# Patient Record
Sex: Female | Born: 2000 | Race: Black or African American | Hispanic: No | Marital: Single | Smoking: Never smoker
Health system: Southern US, Community
[De-identification: ages and names within clinical notes are randomized; demographics above are authoritative.]

## PROBLEM LIST (undated history)

## (undated) DIAGNOSIS — N83209 Unspecified ovarian cyst, unspecified side: Secondary | ICD-10-CM

## (undated) DIAGNOSIS — G43109 Migraine with aura, not intractable, without status migrainosus: Secondary | ICD-10-CM

## (undated) HISTORY — PX: MOUTH SURGERY: SHX715

## (undated) HISTORY — PX: UMBILICAL HERNIA REPAIR: SHX196

## (undated) HISTORY — DX: Migraine with aura, not intractable, without status migrainosus: G43.109

---
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
## 2019-05-24 ENCOUNTER — Encounter (HOSPITAL_COMMUNITY): Payer: Self-pay | Admitting: Emergency Medicine

## 2019-05-24 ENCOUNTER — Emergency Department (HOSPITAL_COMMUNITY)
Admission: EM | Admit: 2019-05-24 | Discharge: 2019-05-24 | Disposition: A | Discharge status: Home / Self Care | Payer: BC Managed Care – PPO | Attending: Emergency Medicine | Admitting: Emergency Medicine

## 2019-05-24 DIAGNOSIS — R197 Diarrhea, unspecified: Secondary | ICD-10-CM | POA: Diagnosis not present

## 2019-05-24 DIAGNOSIS — R112 Nausea with vomiting, unspecified: Secondary | ICD-10-CM | POA: Diagnosis not present

## 2019-05-24 DIAGNOSIS — R1084 Generalized abdominal pain: Secondary | ICD-10-CM | POA: Diagnosis not present

## 2019-05-24 LAB — COMPREHENSIVE METABOLIC PANEL
ALT: 23 U/L (ref 0–44)
AST: 27 U/L (ref 15–41)
Albumin: 4.4 g/dL (ref 3.5–5.0)
Alkaline Phosphatase: 25 U/L — ABNORMAL LOW (ref 38–126)
Anion gap: 11 (ref 5–15)
BUN: 11 mg/dL (ref 6–20)
CO2: 20 mmol/L — ABNORMAL LOW (ref 22–32)
Calcium: 9.5 mg/dL (ref 8.9–10.3)
Chloride: 108 mmol/L (ref 98–111)
Creatinine, Ser: 0.76 mg/dL (ref 0.44–1.00)
GFR calc Af Amer: >60 mL/min (ref >60)
GFR calc non Af Amer: >60 mL/min (ref >60)
Glucose, Bld: 136 mg/dL — ABNORMAL HIGH (ref 70–99)
Potassium: 3.8 mmol/L (ref 3.5–5.1)
Sodium: 139 mmol/L (ref 135–145)
Total Bilirubin: 0.6 mg/dL (ref 0.3–1.2)
Total Protein: 8.6 g/dL — ABNORMAL HIGH (ref 6.5–8.1)

## 2019-05-24 LAB — I-STAT BETA HCG BLOOD, ED (MC, WL, AP ONLY): I-stat hCG, quantitative: <5 m[IU]/mL (ref <5)

## 2019-05-24 LAB — CBC
HCT: 36.9 % (ref 36.0–46.0)
Hemoglobin: 11.3 g/dL — ABNORMAL LOW (ref 12.0–15.0)
MCH: 23.6 pg — ABNORMAL LOW (ref 26.0–34.0)
MCHC: 30.6 g/dL (ref 30.0–36.0)
MCV: 77 fL — ABNORMAL LOW (ref 80.0–100.0)
Platelets: 374 10*3/uL (ref 150–400)
RBC: 4.79 MIL/uL (ref 3.87–5.11)
RDW: 17 % — ABNORMAL HIGH (ref 11.5–15.5)
WBC: 16.5 10*3/uL — ABNORMAL HIGH (ref 4.0–10.5)
nRBC: 0 % (ref 0.0–0.2)

## 2019-05-24 LAB — LIPASE, BLOOD: Lipase: 26 U/L (ref 11–51)

## 2019-05-24 MED ORDER — ALUM & MAG HYDROXIDE-SIMETH 200-200-20 MG/5ML PO SUSP
30.0000 mL | Freq: Once | ORAL | Status: AC
  Administered 2019-05-24: 30 mL via ORAL
  Filled 2019-05-24: qty 30

## 2019-05-24 MED ORDER — SODIUM CHLORIDE 0.9 % IV BOLUS
1000.0000 mL | Freq: Once | INTRAVENOUS | Status: AC
  Administered 2019-05-24: 1000 mL via INTRAVENOUS

## 2019-05-24 MED ORDER — SODIUM CHLORIDE 0.9% FLUSH
3.0000 mL | Freq: Once | INTRAVENOUS | Status: AC
  Administered 2019-05-24: 3 mL via INTRAVENOUS

## 2019-05-24 MED ORDER — ONDANSETRON HCL 4 MG/2ML IJ SOLN
4.0000 mg | Freq: Once | INTRAMUSCULAR | Status: AC
  Administered 2019-05-24: 4 mg via INTRAVENOUS
  Filled 2019-05-24: qty 2

## 2019-05-24 MED ORDER — ONDANSETRON 4 MG PO TBDP: ORAL_TABLET | ORAL | 0 refills | Status: DC

## 2019-05-24 NOTE — Discharge Instructions (Signed)
Most commonly this is caused by a virus.  Please return for inability to eat or drink worsening abdominal pain or fever.

## 2019-05-24 NOTE — ED Triage Notes (Signed)
Patient here from home with complaints of abd pain, nausea, vomiting that started 3 hours ago.

## 2019-05-24 NOTE — ED Notes (Signed)
Pt knows a urine specimen is needed and knows to call staff when she is able to void.

## 2019-05-24 NOTE — ED Provider Notes (Signed)
[facility]-EMERGENCY DEPT Provider Note   CSN: [id number] Arrival date & time: 05/24/19  0309    History   Chief Complaint Chief Complaint  Patient presents with  . Abdominal Pain  . Nausea  . Emesis    HPI Christina Mays is a 18 y.o. female.     18 yo F with a chief complaint of nausea and vomiting.  Going on for about 4 hours.  Denies any suspicious food intake prior to the event.  Planing of diffuse abdominal pain.  No fevers or chills.  A very small amount of diarrhea.  No individuals near her with similar illness.  The history is provided by the patient.  Abdominal Pain Pain location:  Generalized Pain quality: aching and cramping   Pain radiates to:  Does not radiate Pain severity:  Moderate Onset quality:  Gradual Duration:  4 hours Timing:  Constant Progression:  Worsening Chronicity:  New Relieved by:  Nothing Worsened by:  Nothing Ineffective treatments:  None tried Associated symptoms: diarrhea (a little), nausea and vomiting   Associated symptoms: no chest pain, no chills, no dysuria, no fever and no shortness of breath   Emesis Associated symptoms: abdominal pain and diarrhea (a little)   Associated symptoms: no arthralgias, no chills, no fever, no headaches and no myalgias     History reviewed. No pertinent past medical history.  There are no active problems to display for this patient.   Past Surgical History:  Procedure Laterality Date  . MOUTH SURGERY    . UMBILICAL HERNIA REPAIR       OB History   No obstetric history on file.      Home Medications    Prior to Admission medications   Medication Sig Start Date End Date Taking? Authorizing Provider  JUNEL FE 1/20 1-20 MG-MCG tablet Take 1 tablet by mouth daily. 05/18/19  Yes [provider]  ondansetron (ZOFRAN ODT) 4 MG disintegrating tablet 4mg  ODT q4 hours prn nausea/vomit 05/24/19   Melene PlanFloyd, Tameya Kuznia, DO    Family History No family history on file.  Social  History Social History   Tobacco Use  . Smoking status: Never Smoker  . Smokeless tobacco: Never Used  Substance Use Topics  . Alcohol use: No  . Drug use: Not on file     Allergies   Patient has no known allergies.   Review of Systems Review of Systems  Constitutional: Negative for chills and fever.  HENT: Negative for congestion and rhinorrhea.   Eyes: Negative for redness and visual disturbance.  Respiratory: Negative for shortness of breath and wheezing.   Cardiovascular: Negative for chest pain and palpitations.  Gastrointestinal: Positive for abdominal pain, diarrhea (a little), nausea and vomiting.  Genitourinary: Negative for dysuria and urgency.  Musculoskeletal: Negative for arthralgias and myalgias.  Skin: Negative for pallor and wound.  Neurological: Negative for dizziness and headaches.     Physical Exam Updated Vital Signs BP 117/71 (BP Location: Right Arm)   Pulse 80   Temp 98.3 F (36.8 C) (Oral)   Resp 14   Ht 5' (1.524 m)   Wt 52.2 kg   SpO2 100%   BMI 22.46 kg/m   Physical Exam Vitals signs and nursing note reviewed.  Constitutional:      General: She is not in acute distress.    Appearance: She is well-developed. She is not diaphoretic.  HENT:     Head: Normocephalic and atraumatic.  Eyes:     Pupils: Pupils  are equal, round, and reactive to light.  Neck:     Musculoskeletal: Normal range of motion and neck supple.  Cardiovascular:     Rate and Rhythm: Normal rate and regular rhythm.     Heart sounds: No murmur. No friction rub. No gallop.   Pulmonary:     Effort: Pulmonary effort is normal.     Breath sounds: No wheezing or rales.  Abdominal:     General: There is no distension.     Palpations: Abdomen is soft.     Tenderness: There is generalized abdominal tenderness.  Musculoskeletal:        General: No tenderness.  Skin:    General: Skin is warm and dry.  Neurological:     Mental Status: She is alert and oriented to person,  place, and time.  Psychiatric:        Behavior: Behavior normal.      ED Treatments / Results  Labs (all labs ordered are listed, but only abnormal results are displayed) Labs Reviewed  COMPREHENSIVE METABOLIC PANEL - Abnormal; Notable for the following components:      Result Value   CO2 20 (*)    Glucose, Bld 136 (*)    Total Protein 8.6 (*)    Alkaline Phosphatase 25 (*)    All other components within normal limits  CBC - Abnormal; Notable for the following components:   WBC 16.5 (*)    Hemoglobin 11.3 (*)    MCV 77.0 (*)    MCH 23.6 (*)    RDW 17.0 (*)    All other components within normal limits  LIPASE, BLOOD  URINALYSIS, ROUTINE W REFLEX MICROSCOPIC  I-STAT BETA HCG BLOOD, ED ([facility], [facility], [facility] ONLY)    EKG None  Radiology No results found.  Procedures Procedures (including critical care time)  Medications Ordered in ED Medications  sodium chloride flush (NS) 0.9 % injection 3 mL (3 mLs Intravenous Given 05/24/19 0400)  sodium chloride 0.9 % bolus 1,000 mL (1,000 mLs Intravenous New Bag/Given 05/24/19 0359)  ondansetron (ZOFRAN) injection 4 mg (4 mg Intravenous Given 05/24/19 0400)  alum & mag hydroxide-simeth (MAALOX/MYLANTA) 200-200-20 MG/5ML suspension 30 mL (30 mLs Oral Given 05/24/19 0350)     Initial Impression / Assessment and Plan / ED Course  I have reviewed the triage vital signs and the nursing notes.  Pertinent labs & imaging results that were available during my care of the patient were reviewed by me and considered in my medical decision making (see chart for details).        18 yo F with a chief complaint of nausea vomiting and diffuse abdominal tenderness.  This started about 4 hours ago.  We will give nausea medicine fluids and reassess.  Patient feeling much better on reassessment.  Lab work without significant LFT elevation lipase is normal patient is not pregnant.  We will discharge the patient home.  PCP follow-up.  4:47 AM:  I have  discussed the diagnosis/risks/treatment options with the patient and believe the pt to be eligible for discharge home to follow-up with PCP. We also discussed returning to the ED immediately if new or worsening sx occur. We discussed the sx which are most concerning (e.g., sudden worsening pain, fever, inability to tolerate by mouth) that necessitate immediate return. Medications administered to the patient during their visit and any new prescriptions provided to the patient are listed below.  Medications given during this visit Medications  sodium chloride flush (NS) 0.9 % injection  3 mL (3 mLs Intravenous Given 05/24/19 0400)  sodium chloride 0.9 % bolus 1,000 mL (1,000 mLs Intravenous New Bag/Given 05/24/19 0359)  ondansetron (ZOFRAN) injection 4 mg (4 mg Intravenous Given 05/24/19 0400)  alum & mag hydroxide-simeth (MAALOX/MYLANTA) 200-200-20 MG/5ML suspension 30 mL (30 mLs Oral Given 05/24/19 0350)     The patient appears reasonably screen and/or stabilized for discharge and I doubt any other medical condition or other [facility] requiring further screening, evaluation, or treatment in the ED at this time prior to discharge.    Final Clinical Impressions(s) / ED Diagnoses   Final diagnoses:  Nausea vomiting and diarrhea    ED Discharge Orders         Ordered    ondansetron (ZOFRAN ODT) 4 MG disintegrating tablet     05/24/19 0445           Melene PlanFloyd, Cynthie Garmon, DO 05/24/19 [phone]

## 2019-09-05 ENCOUNTER — Encounter (HOSPITAL_COMMUNITY): Payer: Self-pay | Admitting: Emergency Medicine

## 2019-09-05 ENCOUNTER — Emergency Department (HOSPITAL_COMMUNITY)
Admission: EM | Admit: 2019-09-05 | Discharge: 2019-09-05 | Disposition: A | Discharge status: Home / Self Care | Payer: BC Managed Care – PPO | Attending: Emergency Medicine | Admitting: Emergency Medicine

## 2019-09-05 ENCOUNTER — Other Ambulatory Visit: Payer: Self-pay

## 2019-09-05 ENCOUNTER — Emergency Department (HOSPITAL_COMMUNITY): Payer: BC Managed Care – PPO

## 2019-09-05 DIAGNOSIS — N83202 Unspecified ovarian cyst, left side: Secondary | ICD-10-CM

## 2019-09-05 DIAGNOSIS — R1031 Right lower quadrant pain: Secondary | ICD-10-CM | POA: Insufficient documentation

## 2019-09-05 DIAGNOSIS — R112 Nausea with vomiting, unspecified: Secondary | ICD-10-CM | POA: Diagnosis present

## 2019-09-05 DIAGNOSIS — R1084 Generalized abdominal pain: Secondary | ICD-10-CM

## 2019-09-05 LAB — CBC
HCT: 37.8 % (ref 36.0–46.0)
Hemoglobin: 12.4 g/dL (ref 12.0–15.0)
MCH: 26.9 pg (ref 26.0–34.0)
MCHC: 32.8 g/dL (ref 30.0–36.0)
MCV: 82 fL (ref 80.0–100.0)
Platelets: 281 10*3/uL (ref 150–400)
RBC: 4.61 MIL/uL (ref 3.87–5.11)
RDW: 15.9 % — ABNORMAL HIGH (ref 11.5–15.5)
WBC: 7.2 10*3/uL (ref 4.0–10.5)
nRBC: 0 % (ref 0.0–0.2)

## 2019-09-05 LAB — URINALYSIS, ROUTINE W REFLEX MICROSCOPIC
Bacteria, UA: NONE SEEN
Bilirubin Urine: NEGATIVE
Glucose, UA: NEGATIVE mg/dL
Hgb urine dipstick: NEGATIVE
Ketones, ur: 80 mg/dL — AB
Leukocytes,Ua: NEGATIVE
Nitrite: NEGATIVE
Protein, ur: 30 mg/dL — AB
Specific Gravity, Urine: >1.046 — ABNORMAL HIGH (ref 1.005–1.030)
pH: 6 (ref 5.0–8.0)

## 2019-09-05 LAB — COMPREHENSIVE METABOLIC PANEL
ALT: 27 U/L (ref 0–44)
AST: 35 U/L (ref 15–41)
Albumin: 4.9 g/dL (ref 3.5–5.0)
Alkaline Phosphatase: 34 U/L — ABNORMAL LOW (ref 38–126)
Anion gap: 18 — ABNORMAL HIGH (ref 5–15)
BUN: 13 mg/dL (ref 6–20)
CO2: 18 mmol/L — ABNORMAL LOW (ref 22–32)
Calcium: 9.9 mg/dL (ref 8.9–10.3)
Chloride: 102 mmol/L (ref 98–111)
Creatinine, Ser: 0.76 mg/dL (ref 0.44–1.00)
GFR calc Af Amer: >60 mL/min (ref >60)
GFR calc non Af Amer: >60 mL/min (ref >60)
Glucose, Bld: 115 mg/dL — ABNORMAL HIGH (ref 70–99)
Potassium: 3.3 mmol/L — ABNORMAL LOW (ref 3.5–5.1)
Sodium: 138 mmol/L (ref 135–145)
Total Bilirubin: 1.1 mg/dL (ref 0.3–1.2)
Total Protein: 8.2 g/dL — ABNORMAL HIGH (ref 6.5–8.1)

## 2019-09-05 LAB — RAPID URINE DRUG SCREEN, HOSP PERFORMED
Amphetamines: NOT DETECTED
Barbiturates: NOT DETECTED
Benzodiazepines: NOT DETECTED
Cocaine: NOT DETECTED
Opiates: POSITIVE — AB
Tetrahydrocannabinol: POSITIVE — AB

## 2019-09-05 LAB — LIPASE, BLOOD: Lipase: 22 U/L (ref 11–51)

## 2019-09-05 LAB — I-STAT BETA HCG BLOOD, ED (MC, WL, AP ONLY): I-stat hCG, quantitative: <5 m[IU]/mL (ref <5)

## 2019-09-05 MED ORDER — ONDANSETRON 4 MG PO TBDP: ORAL_TABLET | ORAL | 0 refills | Status: DC

## 2019-09-05 MED ORDER — POTASSIUM CHLORIDE 10 MEQ/100ML IV SOLN
10.0000 meq | Freq: Once | INTRAVENOUS | Status: DC
  Filled 2019-09-05: qty 100

## 2019-09-05 MED ORDER — POTASSIUM CHLORIDE CRYS ER 20 MEQ PO TBCR
40.0000 meq | EXTENDED_RELEASE_TABLET | Freq: Once | ORAL | Status: AC
  Administered 2019-09-05: 40 meq via ORAL
  Filled 2019-09-05: qty 2

## 2019-09-05 MED ORDER — SODIUM CHLORIDE 0.9 % IV BOLUS
1000.0000 mL | Freq: Once | INTRAVENOUS | Status: AC
  Administered 2019-09-05: 1000 mL via INTRAVENOUS

## 2019-09-05 MED ORDER — SODIUM CHLORIDE 0.9% FLUSH: 3.0000 mL | Freq: Once | INTRAVENOUS | Status: DC

## 2019-09-05 MED ORDER — ONDANSETRON HCL 4 MG/2ML IJ SOLN
4.0000 mg | Freq: Once | INTRAMUSCULAR | Status: AC
  Administered 2019-09-05: 4 mg via INTRAVENOUS
  Filled 2019-09-05: qty 2

## 2019-09-05 MED ORDER — DROPERIDOL 2.5 MG/ML IJ SOLN
2.5000 mg | Freq: Once | INTRAMUSCULAR | Status: AC
  Administered 2019-09-05: 2.5 mg via INTRAVENOUS
  Filled 2019-09-05: qty 2

## 2019-09-05 MED ORDER — MORPHINE SULFATE (PF) 4 MG/ML IV SOLN
4.0000 mg | Freq: Once | INTRAVENOUS | Status: AC
  Administered 2019-09-05: 4 mg via INTRAVENOUS
  Filled 2019-09-05: qty 1

## 2019-09-05 MED ORDER — FAMOTIDINE IN NACL 20-0.9 MG/50ML-% IV SOLN
20.0000 mg | Freq: Once | INTRAVENOUS | Status: AC
  Administered 2019-09-05: 10:00:00 20 mg via INTRAVENOUS
  Filled 2019-09-05: qty 50

## 2019-09-05 MED ORDER — FAMOTIDINE 20 MG PO TABS: 20.0000 mg | ORAL_TABLET | Freq: Two times a day (BID) | ORAL | 0 refills | Status: DC

## 2019-09-05 MED ORDER — IOHEXOL 300 MG/ML  SOLN
100.0000 mL | Freq: Once | INTRAMUSCULAR | Status: AC | PRN
  Administered 2019-09-05: 100 mL via INTRAVENOUS

## 2019-09-05 MED ORDER — SODIUM CHLORIDE 0.9 % IV BOLUS
1000.0000 mL | Freq: Once | INTRAVENOUS | Status: AC
  Administered 2019-09-05: 10:00:00 1000 mL via INTRAVENOUS

## 2019-09-05 MED ORDER — POTASSIUM CHLORIDE 10 MEQ/100ML IV SOLN
10.0000 meq | Freq: Once | INTRAVENOUS | Status: AC
  Administered 2019-09-05: 16:00:00 10 meq via INTRAVENOUS

## 2019-09-05 NOTE — ED Provider Notes (Signed)
[facility] EMERGENCY DEPARTMENT Provider Note   CSN: [id number] Arrival date & time: 09/05/19  0911     History   Chief Complaint Chief Complaint  Patient presents with  . Abdominal Pain  . Emesis    HPI Christina Mays is a 18 y.o. female.     Christina Mays is a 18 y.o. female who is otherwise healthy, presents to the emergency department for evaluation of nausea, vomiting and generalized abdominal pain.  Patient reports symptoms began 2 days ago.  Patient reports she has had persistent nausea and vomiting, denies hematemesis.  Patient reports she has not been able to keep down any food or drink.  She reports abdominal pain is generalized and severe.  She denies diarrhea, constipation or blood in the stools.  Denies any dysuria or urinary frequency.  She denies any fevers or chills.  No vaginal bleeding or discharge, no pelvic pain.  Patient has not taken any medications to treat the symptoms prior to arrival.  She does report that she recently restarted birth control, had a severe reaction to birth control previously and wonders if this could be contributing.  No other new medications.  Patient does report intermittent marijuana use, denies any other drug use.  Reports she has had some intermittent issues with nausea and vomiting but never this severe.       History reviewed. No pertinent past medical history.  There are no active problems to display for this patient.   Past Surgical History:  Procedure Laterality Date  . MOUTH SURGERY    . UMBILICAL HERNIA REPAIR       OB History   No obstetric history on file.      Home Medications    Prior to Admission medications   Medication Sig Start Date End Date Taking? Authorizing Provider  famotidine (PEPCID) 20 MG tablet Take 1 tablet (20 mg total) by mouth 2 (two) times daily. 09/05/19   Dartha Lodge, PA-C  JUNEL FE 1/20 1-20 MG-MCG tablet Take 1 tablet by mouth daily. 05/18/19   [provider]   ondansetron (ZOFRAN ODT) 4 MG disintegrating tablet 4mg  ODT q4 hours prn nausea/vomit 09/05/19   11/05/19, PA-C    Family History History reviewed. No pertinent family history.  Social History Social History   Tobacco Use  . Smoking status: Never Smoker  . Smokeless tobacco: Never Used  Substance Use Topics  . Alcohol use: No  . Drug use: Yes    Types: Marijuana    Comment: everyday     Allergies   Patient has no known allergies.   Review of Systems Review of Systems  Constitutional: Negative for chills and fever.  Respiratory: Negative for cough and shortness of breath.   Cardiovascular: Negative for chest pain.  Gastrointestinal: Positive for abdominal pain, nausea and vomiting. Negative for blood in stool, constipation and diarrhea.  Genitourinary: Negative for dysuria, flank pain, frequency, vaginal bleeding and vaginal discharge.  Musculoskeletal: Negative for arthralgias and myalgias.  Skin: Negative for color change and rash.  All other systems reviewed and are negative.    Physical Exam Updated Vital Signs Temp 98.4 F (36.9 C) (Oral)   Resp 18   Ht 5' (1.524 m)   Wt 45.4 kg   LMP 08/06/2019   BMI 19.53 kg/m   Physical Exam Vitals signs and nursing note reviewed.  Constitutional:      General: She is in acute distress.     Appearance: She is  well-developed and normal weight. She is not diaphoretic.     Comments: Appears to be in some distress, actively vomiting, sitting on the floor in room upon my evaluation.  HENT:     Head: Normocephalic and atraumatic.     Mouth/Throat:     Comments: Mucous membranes slightly dry Eyes:     General:        Right eye: No discharge.        Left eye: No discharge.     Pupils: Pupils are equal, round, and reactive to light.  Neck:     Musculoskeletal: Neck supple.  Cardiovascular:     Rate and Rhythm: Normal rate and regular rhythm.     Heart sounds: Normal heart sounds. No murmur. No friction rub.   Pulmonary:     Effort: Pulmonary effort is normal. No respiratory distress.     Breath sounds: Normal breath sounds. No wheezing or rales.     Comments: Respirations equal and unlabored, patient able to speak in full sentences, lungs clear to auscultation bilaterally Abdominal:     General: Bowel sounds are normal. There is no distension.     Palpations: Abdomen is soft. There is no mass.     Tenderness: There is generalized abdominal tenderness. There is guarding.     Comments: Abdomen is soft and nondistended, bowel sounds present throughout, abdomen with generalized tenderness with mild guarding, pain does not localize to any 1 region.  Musculoskeletal:        General: No deformity.  Skin:    General: Skin is warm and dry.     Capillary Refill: Capillary refill takes less than 2 seconds.  Neurological:     Mental Status: She is alert.     Coordination: Coordination normal.     Comments: Speech is clear, able to follow commands Moves extremities without ataxia, coordination intact  Psychiatric:        Mood and Affect: Mood normal.        Behavior: Behavior normal.      ED Treatments / Results  Labs (all labs ordered are listed, but only abnormal results are displayed) Labs Reviewed  COMPREHENSIVE METABOLIC PANEL - Abnormal; Notable for the following components:      Result Value   Potassium 3.3 (*)    CO2 18 (*)    Glucose, Bld 115 (*)    Total Protein 8.2 (*)    Alkaline Phosphatase 34 (*)    Anion gap 18 (*)    All other components within normal limits  CBC - Abnormal; Notable for the following components:   RDW 15.9 (*)    All other components within normal limits  URINALYSIS, ROUTINE W REFLEX MICROSCOPIC - Abnormal; Notable for the following components:   Specific Gravity, Urine >1.046 (*)    Ketones, ur 80 (*)    Protein, ur 30 (*)    All other components within normal limits  RAPID URINE DRUG SCREEN, HOSP PERFORMED - Abnormal; Notable for the following  components:   Opiates POSITIVE (*)    Tetrahydrocannabinol POSITIVE (*)    All other components within normal limits  LIPASE, BLOOD  I-STAT BETA HCG BLOOD, ED ([facility], [facility], [facility] ONLY)    EKG None  Radiology Ct Abdomen Pelvis W Contrast  Result Date: 09/05/2019 CLINICAL DATA:  Postprandial pain. EXAM: CT ABDOMEN AND PELVIS WITH CONTRAST TECHNIQUE: Multidetector CT imaging of the abdomen and pelvis was performed using the standard protocol following bolus administration of intravenous contrast. CONTRAST:  100mL OMNIPAQUE IOHEXOL 300 MG/ML  SOLN COMPARISON:  None. FINDINGS: Lower chest: Lung bases are clear. No effusions. Heart is normal size. Hepatobiliary: No suspicious focal hepatic abnormality. Gallbladder unremarkable. Low-density adjacent to the falciform ligament likely reflects focal fatty infiltration. Pancreas: No focal abnormality or ductal dilatation. Spleen: No focal abnormality.  Normal size. Adrenals/Urinary Tract: No adrenal abnormality. No focal renal abnormality. No stones or hydronephrosis. Urinary bladder is unremarkable. Stomach/Bowel: Stomach, large and small bowel grossly unremarkable. Normal appendix. Vascular/Lymphatic: No evidence of aneurysm or adenopathy. Reproductive: Ovary and right adnexa unremarkable. 4.2 cm cyst in the left ovary. Other: Trace free fluid in the pelvis, likely physiologic. No free air. Musculoskeletal: No acute bony abnormality. IMPRESSION: 4.2 cm left ovarian cyst. Areas of focal fatty infiltration within the liver adjacent to the falciform ligament. Otherwise unremarkable study. Electronically Signed   By: Charlett NoseKevin  Dover M.D.   On: 09/05/2019 12:18    Procedures Procedures (including critical care time)  Medications Ordered in ED Medications  sodium chloride 0.9 % bolus 1,000 mL (0 mLs Intravenous Stopped 09/05/19 1226)  ondansetron (ZOFRAN) injection 4 mg (4 mg Intravenous Given 09/05/19 0946)  morphine 4 MG/ML injection 4 mg (4 mg Intravenous Given  09/05/19 0946)  famotidine (PEPCID) IVPB 20 mg premix (0 mg Intravenous Stopped 09/05/19 1109)  iohexol (OMNIPAQUE) 300 MG/ML solution 100 mL (100 mLs Intravenous Contrast Given 09/05/19 1201)  droperidol (INAPSINE) 2.5 MG/ML injection 2.5 mg (2.5 mg Intravenous Given 09/05/19 1532)  potassium chloride SA (KLOR-CON) CR tablet 40 mEq (40 mEq Oral Given 09/05/19 1454)  sodium chloride 0.9 % bolus 1,000 mL (0 mLs Intravenous Stopped 09/05/19 1650)  potassium chloride 10 mEq in 100 mL IVPB (0 mEq Intravenous Stopped 09/05/19 1650)     Initial Impression / Assessment and Plan / ED Course  I have reviewed the triage vital signs and the nursing notes.  Pertinent labs & imaging results that were available during my care of the patient were reviewed by me and considered in my medical decision making (see chart for details).  18 year old female presents with 2 days of persistent nausea, vomiting and generalized abdominal pain.  On arrival she is afebrile with normal vitals.  Actively vomiting during my evaluation, no hematemesis.  Abdomen with generalized tenderness with slight guarding.  Lungs clear.  She is not having any urinary symptoms, no vaginal discharge or bleeding.  Patient does report previous hernia surgery.  She denies diarrhea or constipation, does think she is passing gas.  Does admit to marijuana use.  Patient has been seen for nausea and vomiting a few times in the past.  Differential includes gastritis, bowel obstruction, cyclic vomiting or hyperemesis syndrome.  Without focal abdominal pain doubt appendicitis, patient is not having any symptoms to suggest pelvic etiology.  Will get abdominal labs, CT abdomen pelvis given history of previous hernia surgery to rule out obstruction.  Will give IV fluids, Zofran, Pepcid and morphine for symptom management.  Will check EKG for QTC in case patient requires repeat antiemetic dosing.  Lab work is overall reassuring, no leukocytosis and normal hemoglobin,  mild hypokalemia of 3.3, CO2 of 18 and anion gap of 18, likely in the setting of dehydration, normal glucose, normal renal and liver function, normal lipase.  Negative pregnancy.  Urinalysis with 80 of ketones again suggestive of dehydration, no signs of infection.  UDS positive for THC and opiates.  CT abdomen pelvis shows a 4.2 cm left ovarian cyst, otherwise CT is unremarkable.  I have  low suspicion that patient's ovarian cyst is related to pain today given that pain is generalized and she does not have focal severe tenderness in the left lower quadrant, I have low suspicion for torsion, there is no edema or stranding surrounding the left ovary to suggest torsion in this area on CT.  Discussed this with patient, do not feel that pelvic ultrasound is indicated at this time.  Initially after medications patient had significant improvement in her symptoms, but after drinking water she had episode of emesis.  Given reassuring CT and lab work this is increasingly concerning for cyclic vomiting syndrome.  Droperidol ordered as well as IV potassium replacement given persistent vomiting.  Went in to reevaluate patient, she has not yet received droperidol or potassium, but is feeling much better again, will trial p.o. challenge and attempt giving p.o. potassium as patient would like to go home now that she is feeling better.  After potassium patient had a second episode of emesis.  Will give droperidol and IV potassium and then reevaluate.  After droperidol patient has had no additional vomiting.  She has been able to keep down multiple cups of ginger ale.  At this time feel patient is stable for discharge.  Will send home with Zofran, Pepcid.  I have asked her to start with a clear liquid diet and advance her diet slowly.  I have recommended GI follow-up regarding possible cyclic vomiting syndrome.  She will follow-up with her OB/GYN regarding ovarian cyst.  Strict return precautions discussed.  Patient  expresses understanding and agreement with plan.  Discharged home in good condition.  Final Clinical Impressions(s) / ED Diagnoses   Final diagnoses:  Generalized abdominal pain  Non-intractable vomiting with nausea, unspecified vomiting type  Left ovarian cyst    ED Discharge Orders         Ordered    ondansetron (ZOFRAN ODT) 4 MG disintegrating tablet     09/05/19 1642    famotidine (PEPCID) 20 MG tablet  2 times daily     09/05/19 1642           Janet Berlin 09/06/19 2348    Davonna Belling, MD 09/13/19 [phone]

## 2019-09-05 NOTE — Discharge Instructions (Addendum)
Your work-up today has overall been reassuring.  Please use Zofran as needed for nausea and vomiting.  I would like for you to stick to a clear liquid diet for the next 24 hours and then slowly advance diet to bland foods, and then return to your regular diet.  Please eat small frequent meals, when your stomach sits empty that can trigger nausea and vomiting.  Take Pepcid twice daily before breakfast and dinner to help with stomach irritation. Avoid Marijuana, as it can worsen persistent vomiting.  Your CT scan today did show that you have a 4.2 cm left ovarian cyst, please follow-up with your OB/GYN regarding this, I would also recommend that you discontinue birth control be recently started as it could have contributed to your nausea and vomiting.  Make sure you are using a alternate form of contraception until you follow-up with your OB/GYN to discuss other options.  If you develop severe left lower abdominal pain this can be a sign of ovarian torsion, and you should return to the ED for reevaluation.  Follow-up with GI doctor regarding your intermittent issues with vomiting and decreased appetite. Call to schedule appointment with [facility]  Return to the emergency department for persistent vomiting despite medications, new or worsening abdominal pain or any other new or concerning symptoms.

## 2019-09-05 NOTE — ED Triage Notes (Signed)
Pt reports persistent vomiting for the last 2 days. Denies recent fever. Reports generalized abdominal pain. No diarrhea. No cough, cold symptoms.

## 2019-09-05 NOTE — ED Notes (Signed)
Pt dc'd home w/all belongings, a/o x4, ambulatory on dc, driven home by friend  

## 2019-10-01 ENCOUNTER — Other Ambulatory Visit: Payer: Self-pay

## 2019-10-02 ENCOUNTER — Ambulatory Visit: Payer: BC Managed Care – PPO | Admitting: Obstetrics and Gynecology

## 2019-10-02 ENCOUNTER — Encounter: Payer: Self-pay | Admitting: Obstetrics and Gynecology

## 2019-10-02 ENCOUNTER — Telehealth: Payer: Self-pay | Admitting: Obstetrics and Gynecology

## 2019-10-02 VITALS — BP 100/58 | HR 80 | Temp 97.2°F | Ht 60.0 in | Wt 96.8 lb

## 2019-10-02 DIAGNOSIS — N760 Acute vaginitis: Secondary | ICD-10-CM

## 2019-10-02 DIAGNOSIS — B9689 Other specified bacterial agents as the cause of diseases classified elsewhere: Secondary | ICD-10-CM | POA: Diagnosis not present

## 2019-10-02 DIAGNOSIS — R1032 Left lower quadrant pain: Secondary | ICD-10-CM

## 2019-10-02 DIAGNOSIS — Z7189 Other specified counseling: Secondary | ICD-10-CM

## 2019-10-02 DIAGNOSIS — Z23 Encounter for immunization: Secondary | ICD-10-CM

## 2019-10-02 DIAGNOSIS — R110 Nausea: Secondary | ICD-10-CM

## 2019-10-02 DIAGNOSIS — Z113 Encounter for screening for infections with a predominantly sexual mode of transmission: Secondary | ICD-10-CM

## 2019-10-02 DIAGNOSIS — G43109 Migraine with aura, not intractable, without status migrainosus: Secondary | ICD-10-CM | POA: Insufficient documentation

## 2019-10-02 DIAGNOSIS — Z01419 Encounter for gynecological examination (general) (routine) without abnormal findings: Secondary | ICD-10-CM | POA: Diagnosis not present

## 2019-10-02 DIAGNOSIS — Z3009 Encounter for other general counseling and advice on contraception: Secondary | ICD-10-CM

## 2019-10-02 DIAGNOSIS — Z0000 Encounter for general adult medical examination without abnormal findings: Secondary | ICD-10-CM | POA: Diagnosis not present

## 2019-10-02 DIAGNOSIS — R634 Abnormal weight loss: Secondary | ICD-10-CM

## 2019-10-02 DIAGNOSIS — Z7185 Encounter for immunization safety counseling: Secondary | ICD-10-CM

## 2019-10-02 DIAGNOSIS — N898 Other specified noninflammatory disorders of vagina: Secondary | ICD-10-CM

## 2019-10-02 DIAGNOSIS — N83202 Unspecified ovarian cyst, left side: Secondary | ICD-10-CM

## 2019-10-02 MED ORDER — METRONIDAZOLE 0.75 % VA GEL: 1.0000 | Freq: Every day | VAGINAL | 0 refills | Status: DC

## 2019-10-02 NOTE — Telephone Encounter (Signed)
Call placed to convey benefits for Mirena insertion.Spoke with the patient conveyed benefits. She understands/agreeable with the benefits. Patient to call at onset of cycle for scheduling.

## 2019-10-02 NOTE — Patient Instructions (Signed)
EXERCISE AND DIET:  We recommended that you start or continue a regular exercise program for good health. Regular exercise means any activity that makes your heart beat faster and makes you sweat.  We recommend exercising at least 30 minutes per day at least 3 days a week, preferably 4 or 5.  We also recommend a diet low in fat and sugar.  Inactivity, poor dietary choices and obesity can cause diabetes, heart attack, stroke, and kidney damage, among others.    ALCOHOL AND SMOKING:  Women should limit their alcohol intake to no more than 7 drinks/beers/glasses of wine (combined, not each!) per week. Moderation of alcohol intake to this level decreases your risk of breast cancer and liver damage. And of course, no recreational drugs are part of a healthy lifestyle.  And absolutely no smoking or even second hand smoke. Most people know smoking can cause heart and lung diseases, but did you know it also contributes to weakening of your bones? Aging of your skin?  Yellowing of your teeth and nails?  CALCIUM AND VITAMIN D:  Adequate intake of calcium and Vitamin D are recommended.  The recommendations for exact amounts of these supplements seem to change often, but generally speaking 1,000 mg of calcium (between diet and supplement) and 800 units of Vitamin D per day seems prudent. Certain women may benefit from higher intake of Vitamin D.  If you are among these women, your doctor will have told you during your visit.    PAP SMEARS:  Pap smears, to check for cervical cancer or precancers,  have traditionally been done yearly, although recent scientific advances have shown that most women can have pap smears less often.  However, every woman still should have a physical exam from her gynecologist every year. It will include a breast check, inspection of the vulva and vagina to check for abnormal growths or skin changes, a visual exam of the cervix, and then an exam to evaluate the size and shape of the uterus and  ovaries.  And after 19 years of age, a rectal exam is indicated to check for rectal cancers. We will also provide age appropriate advice regarding health maintenance, like when you should have certain vaccines, screening for sexually transmitted diseases, bone density testing, colonoscopy, mammograms, etc.   MAMMOGRAMS:  All women over 40 years old should have a yearly mammogram. Many facilities now offer a "3D" mammogram, which may cost around $50 extra out of pocket. If possible,  we recommend you accept the option to have the 3D mammogram performed.  It both reduces the number of women who will be called back for extra views which then turn out to be normal, and it is better than the routine mammogram at detecting truly abnormal areas.    COLON CANCER SCREENING: Now recommend starting at age 45. At this time colonoscopy is not covered for routine screening until 50. There are take home tests that can be done between 45-49.   COLONOSCOPY:  Colonoscopy to screen for colon cancer is recommended for all women at age 50.  We know, you hate the idea of the prep.  We agree, BUT, having colon cancer and not knowing it is worse!!  Colon cancer so often starts as a polyp that can be seen and removed at colonscopy, which can quite literally save your life!  And if your first colonoscopy is normal and you have no family history of colon cancer, most women don't have to have it again for   10 years.  Once every ten years, you can do something that may end up saving your life, right?  We will be happy to help you get it scheduled when you are ready.  Be sure to check your insurance coverage so you understand how much it will cost.  It may be covered as a preventative service at no cost, but you should check your particular policy.      Breast Self-Awareness Breast self-awareness means being familiar with how your breasts look and feel. It involves checking your breasts regularly and reporting any changes to your  health care provider. Practicing breast self-awareness is important. A change in your breasts can be a sign of a serious medical problem. Being familiar with how your breasts look and feel allows you to find any problems early, when treatment is more likely to be successful. All women should practice breast self-awareness, including women who have had breast implants. How to do a breast self-exam One way to learn what is normal for your breasts and whether your breasts are changing is to do a breast self-exam. To do a breast self-exam: Look for Changes  1. Remove all the clothing above your waist. 2. Stand in front of a mirror in a room with good lighting. 3. Put your hands on your hips. 4. Push your hands firmly downward. 5. Compare your breasts in the mirror. Look for differences between them (asymmetry), such as: ? Differences in shape. ? Differences in size. ? Puckers, dips, and bumps in one breast and not the other. 6. Look at each breast for changes in your skin, such as: ? Redness. ? Scaly areas. 7. Look for changes in your nipples, such as: ? Discharge. ? Bleeding. ? Dimpling. ? Redness. ? A change in position. Feel for Changes Carefully feel your breasts for lumps and changes. It is best to do this while lying on your back on the floor and again while sitting or standing in the shower or tub with soapy water on your skin. Feel each breast in the following way:  Place the arm on the side of the breast you are examining above your head.  Feel your breast with the other hand.  Start in the nipple area and make  inch (2 cm) overlapping circles to feel your breast. Use the pads of your three middle fingers to do this. Apply light pressure, then medium pressure, then firm pressure. The light pressure will allow you to feel the tissue closest to the skin. The medium pressure will allow you to feel the tissue that is a little deeper. The firm pressure will allow you to feel the tissue  close to the ribs.  Continue the overlapping circles, moving downward over the breast until you feel your ribs below your breast.  Move one finger-width toward the center of the body. Continue to use the  inch (2 cm) overlapping circles to feel your breast as you move slowly up toward your collarbone.  Continue the up and down exam using all three pressures until you reach your armpit.  Write Down What You Find  Write down what is normal for each breast and any changes that you find. Keep a written record with breast changes or normal findings for each breast. By writing this information down, you do not need to depend only on memory for size, tenderness, or location. Write down where you are in your menstrual cycle, if you are still menstruating. If you are having trouble noticing differences   in your breasts, do not get discouraged. With time you will become more familiar with the variations in your breasts and more comfortable with the exam. How often should I examine my breasts? Examine your breasts every month. If you are breastfeeding, the best time to examine your breasts is after a feeding or after using a breast pump. If you menstruate, the best time to examine your breasts is 5-7 days after your period is over. During your period, your breasts are lumpier, and it may be more difficult to notice changes. When should I see my health care provider? See your health care provider if you notice:  A change in shape or size of your breasts or nipples.  A change in the skin of your breast or nipples, such as a reddened or scaly area.  Unusual discharge from your nipples.  A lump or thick area that was not there before.  Pain in your breasts.  Anything that concerns you.  Vaginitis Vaginitis is a condition in which the vaginal tissue swells and becomes red (inflamed). This condition is most often caused by a change in the normal balance of bacteria and yeast that live in the vagina.  This change causes an overgrowth of certain bacteria or yeast, which causes the inflammation. There are different types of vaginitis, but the most common types are:  Bacterial vaginosis.  Yeast infection (candidiasis).  Trichomoniasis vaginitis. This is a sexually transmitted disease (STD).  Viral vaginitis.  Atrophic vaginitis.  Allergic vaginitis. What are the causes? The cause of this condition depends on the type of vaginitis. It can be caused by:  Bacteria (bacterial vaginosis).  Yeast, which is a fungus (yeast infection).  A parasite (trichomoniasis vaginitis).  A virus (viral vaginitis).  Low hormone levels (atrophic vaginitis). Low hormone levels can occur during pregnancy, breastfeeding, or after menopause.  Irritants, such as bubble baths, scented tampons, and feminine sprays (allergic vaginitis). Other factors can change the normal balance of the yeast and bacteria that live in the vagina. These include:  Antibiotic medicines.  Poor hygiene.  Diaphragms, vaginal sponges, spermicides, birth control pills, and intrauterine devices (IUD).  Sex.  Infection.  Uncontrolled diabetes.  A weakened defense (immune) system. What increases the risk? This condition is more likely to develop in women who:  Smoke.  Use vaginal douches, scented tampons, or scented sanitary pads.  Wear tight-fitting pants.  Wear thong underwear.  Use oral birth control pills or an IUD.  Have sex without a condom.  Have multiple sex partners.  Have an STD.  Frequently use the spermicide nonoxynol-9.  Eat lots of foods high in sugar.  Have uncontrolled diabetes.  Have low estrogen levels.  Have a weakened immune system from an immune disorder or medical treatment.  Are pregnant or breastfeeding. What are the signs or symptoms? Symptoms vary depending on the cause of the vaginitis. Common symptoms include:  Abnormal vaginal discharge. ? The discharge is white, gray,  or yellow with bacterial vaginosis. ? The discharge is thick, white, and cheesy with a yeast infection. ? The discharge is frothy and yellow or greenish with trichomoniasis.  A bad vaginal smell. The smell is fishy with bacterial vaginosis.  Vaginal itching, pain, or swelling.  Sex that is painful.  Pain or burning when urinating. Sometimes there are no symptoms. How is this diagnosed? This condition is diagnosed based on your symptoms and medical history. A physical exam, including a pelvic exam, will also be done. You may also have other   tests, including:  Tests to determine the pH level (acidity or alkalinity) of your vagina.  A whiff test, to assess the odor that results when a sample of your vaginal discharge is mixed with a potassium hydroxide solution.  Tests of vaginal fluid. A sample will be examined under a microscope. How is this treated? Treatment varies depending on the type of vaginitis you have. Your treatment may include:  Antibiotic creams or pills to treat bacterial vaginosis and trichomoniasis.  Antifungal medicines, such as vaginal creams or suppositories, to treat a yeast infection.  Medicine to ease discomfort if you have viral vaginitis. Your sexual partner should also be treated.  Estrogen delivered in a cream, pill, suppository, or vaginal ring to treat atrophic vaginitis. If vaginal dryness occurs, lubricants and moisturizing creams may help. You may need to avoid scented soaps, sprays, or douches.  Stopping use of a product that is causing allergic vaginitis. Then using a vaginal cream to treat the symptoms. Follow these instructions at home: Lifestyle  Keep your genital area clean and dry. Avoid soap, and only rinse the area with water.  Do not douche or use tampons until your health care provider says it is okay to do so. Use sanitary pads, if needed.  Do not have sex until your health care provider approves. When you can return to sex, practice  safe sex and use condoms.  Wipe from front to back. This avoids the spread of bacteria from the rectum to the vagina. General instructions  Take over-the-counter and prescription medicines only as told by your health care provider.  If you were prescribed an antibiotic medicine, take or use it as told by your health care provider. Do not stop taking or using the antibiotic even if you start to feel better.  Keep all follow-up visits as told by your health care provider. This is important. How is this prevented?  Use mild, non-scented products. Do not use things that can irritate the vagina, such as fabric softeners. Avoid the following products if they are scented: ? Feminine sprays. ? Detergents. ? Tampons. ? Feminine hygiene products. ? Soaps or bubble baths.  Let air reach your genital area. ? Wear cotton underwear to reduce moisture buildup. ? Avoid wearing underwear while you sleep. ? Avoid wearing tight pants and underwear or nylons without a cotton panel. ? Avoid wearing thong underwear.  Take off any wet clothing, such as bathing suits, as soon as possible.  Practice safe sex and use condoms. Contact a health care provider if:  You have abdominal pain.  You have a fever.  You have symptoms that last for more than 2-3 days. Get help right away if:  You have a fever and your symptoms suddenly get worse. Summary  Vaginitis is a condition in which the vaginal tissue becomes inflamed.This condition is most often caused by a change in the normal balance of bacteria and yeast that live in the vagina.  Treatment varies depending on the type of vaginitis you have.  Do not douche, use tampons , or have sex until your health care provider approves. When you can return to sex, practice safe sex and use condoms. This information is not intended to replace advice given to you by your health care provider. Make sure you discuss any questions you have with your health care  provider. Document Released: 09/18/2007 Document Revised: 11/03/2017 Document Reviewed: 12/27/2016 Elsevier Patient Education  2020 [employer]  

## 2019-10-02 NOTE — Progress Notes (Signed)
18 y.o. G0P0000 Single Black or African American Not Hispanic or Latino female here for ER follow up for cyst on left ovary. She was seen in the ER on 09/05/19 with abdominal pain. CT scan showed a 4.2 cm cyst in her left ovary. She would also like an annual exam today. She was on OCP's, Junel 1/20 for about 4 months. She started getting nausea on Junel, she stopped taking them in June. She continued to have issues with nausea after stopping to [place].  She has nausea most days. She has lost over 30 lbs total, at least down 20 lbs since June. Not on purpose. She is forcing herself to eat. Only one episode of emesis since she was in the ER on 09/05/19. BM are normal.  She is having some pain in her LLQ, shooting pain, intermittent, daily. She can hear her stomach growling when she has the pain.    Period Cycle (Days): 28 Period Duration (Days): 4 days Period Pattern: Regular Menstrual Flow: Heavy, Moderate Menstrual Control: Tampon Menstrual Control Change Freq (Hours): changes every 2-3 hours Dysmenorrhea: (!) Severe(severe on the first day, mild on the other days) Dysmenorrhea Symptoms: Cramping  Takes advil for the cramps, helps some.   She is sexually active, same partner for a year. Using condoms. No dyspareunia.   Patient's last menstrual period was 09/13/2019 (exact date).          Sexually active: Yes.    The current method of family planning is condoms most of the time.    Exercising: Yes.    running Smoker:  no  Health Maintenance: Pap:  Never History of abnormal Pap:  no TDaP: Thinks UTD Gardasil: Never   reports that she has never smoked. She has never used smokeless tobacco. She reports current drug use. Drug: Marijuana. She reports that she does not drink alcohol. She works at a pre-school, plans to go back to school. Wants to study cosmetology.   Past Medical History:  Diagnosis Date  . Migraine with aura     Past Surgical History:  Procedure Laterality Date  . MOUTH  SURGERY    . UMBILICAL HERNIA REPAIR      Current Outpatient Medications  Medication Sig Dispense Refill  . famotidine (PEPCID) 20 MG tablet Take 1 tablet (20 mg total) by mouth 2 (two) times daily. 30 tablet 0  . ondansetron (ZOFRAN ODT) 4 MG disintegrating tablet 4mg  ODT q4 hours prn nausea/vomit 10 tablet 0   No current facility-administered medications for this visit.     History reviewed. No pertinent family history.  Review of Systems  Constitutional: Positive for appetite change.  HENT: Negative.   Eyes: Negative.   Respiratory: Negative.   Cardiovascular: Negative.   Gastrointestinal: Positive for nausea and vomiting.  Endocrine: Negative.   Genitourinary:       Pelvic pain left side  Musculoskeletal: Negative.   Skin: Negative.   Allergic/Immunologic: Negative.   Neurological: Negative.   Hematological: Negative.   Psychiatric/Behavioral: Negative.     Exam:   BP (!) 100/58 (BP Location: Right Arm, Patient Position: Sitting, Cuff Size: Normal)   Pulse 80   Temp (!) 97.2 F (36.2 C) (Skin)   Ht 5' (1.524 m)   Wt 96 lb 12.8 oz (43.9 kg)   LMP 09/13/2019 (Exact Date)   BMI 18.90 kg/m   Weight change: @WEIGHTCHANGE @ Height:   Height: 5' (152.4 cm)  Ht Readings from Last 3 Encounters:  10/02/19 5' (1.524 m) (5 %, Z= -  1.68)*  09/05/19 5' (1.524 m) (5 %, Z= -1.68)*  05/24/19 5' (1.524 m) (5 %, Z= -1.67)*   * Growth percentiles are based on CDC (Girls, 2-20 Years) data.    General appearance: alert, cooperative and appears stated age Head: Normocephalic, without obvious abnormality, atraumatic Neck: no adenopathy, supple, symmetrical, trachea midline and thyroid normal to inspection and palpation Lungs: clear to auscultation bilaterally Cardiovascular: regular rate and rhythm Breasts: normal appearance, no masses or tenderness Abdomen: soft, non-tender; non distended,  no masses,  no organomegaly Extremities: extremities normal, atraumatic, no cyanosis or  edema Skin: Skin color, texture, turgor normal. No rashes or lesions Lymph nodes: Cervical, supraclavicular, and axillary nodes normal. No abnormal inguinal nodes palpated Neurologic: Grossly normal   Pelvic: External genitalia:  no lesions              Urethra:  normal appearing urethra with no masses, tenderness or lesions              Bartholins and Skenes: normal                 Vagina: normal appearing vagina with normal color with an increase vaginal discharge, white and watery (on questioning she reports a one month h/o d/c, no other symptoms)              Cervix: no cervical motion tenderness and no lesions               Bimanual Exam:  Uterus:  normal size, contour, position, consistency, mobility, non-tender and anteverted              Adnexa: no masses, tender in the left adnexa               Rectovaginal: Confirms               Anus:  normal sphincter tone, no lesions  Chaperone was present for exam.  Wet prep: + clue, no trich, few wbc KOH: no yeast PH: 5   A:  Well Woman with normal exam  Left ovarian cyst  Weight loss  Nausea  LLQ abdominal pain  Contraception, not candidate for OCP's, migraine with aura  Vaginal discharge, BV  P:   Return for ultrasound  Information given on mirena, would like to return for insertion (will return on her cycle)  Refer to GI  Screening labs  STD testing  Discussed breast self exam  Discussed calcium and vit D intake  Start gardasil series  metrogel for BV

## 2019-10-02 NOTE — Addendum Note (Signed)
Addended by: Gwendlyn Deutscher on: 10/02/2019 02:41 PM   Modules accepted: Orders

## 2019-10-02 NOTE — Telephone Encounter (Signed)
Call placed to patient to review benefit and schedule recommended ultrasound. Left voicemail message requesting a return call °

## 2019-10-03 LAB — CBC
Hematocrit: 37.8 % (ref 34.0–46.6)
Hemoglobin: 12.2 g/dL (ref 11.1–15.9)
MCH: 27.1 pg (ref 26.6–33.0)
MCHC: 32.3 g/dL (ref 31.5–35.7)
MCV: 84 fL (ref 79–97)
Platelets: 341 10*3/uL (ref 150–450)
RBC: 4.5 x10E6/uL (ref 3.77–5.28)
RDW: 14.7 % (ref 11.7–15.4)
WBC: 2.8 10*3/uL — ABNORMAL LOW (ref 3.4–10.8)

## 2019-10-03 LAB — LIPID PANEL
Chol/HDL Ratio: 2.5 ratio (ref 0.0–4.4)
Cholesterol, Total: 153 mg/dL (ref 100–169)
HDL: 62 mg/dL (ref >39)
LDL Chol Calc (NIH): 79 mg/dL (ref 0–109)
Triglycerides: 61 mg/dL (ref 0–89)
VLDL Cholesterol Cal: 12 mg/dL (ref 5–40)

## 2019-10-03 LAB — COMPREHENSIVE METABOLIC PANEL
ALT: 7 IU/L (ref 0–32)
AST: 14 IU/L (ref 0–40)
Albumin/Globulin Ratio: 1.4 (ref 1.2–2.2)
Albumin: 4.3 g/dL (ref 3.9–5.0)
Alkaline Phosphatase: 39 IU/L (ref 39–117)
BUN/Creatinine Ratio: 9 (ref 9–23)
BUN: 6 mg/dL (ref 6–20)
Bilirubin Total: 0.3 mg/dL (ref 0.0–1.2)
CO2: 21 mmol/L (ref 20–29)
Calcium: 10.7 mg/dL — ABNORMAL HIGH (ref 8.7–10.2)
Chloride: 104 mmol/L (ref 96–106)
Creatinine, Ser: 0.7 mg/dL (ref 0.57–1.00)
GFR calc Af Amer: 145 mL/min/{1.73_m2} (ref >59)
GFR calc non Af Amer: 126 mL/min/{1.73_m2} (ref >59)
Globulin, Total: 3.1 g/dL (ref 1.5–4.5)
Glucose: 87 mg/dL (ref 65–99)
Potassium: 4 mmol/L (ref 3.5–5.2)
Sodium: 139 mmol/L (ref 134–144)
Total Protein: 7.4 g/dL (ref 6.0–8.5)

## 2019-10-03 LAB — HEP, RPR, HIV PANEL
HIV Screen 4th Generation wRfx: NONREACTIVE
Hepatitis B Surface Ag: NEGATIVE
RPR Ser Ql: NONREACTIVE

## 2019-10-03 LAB — TSH: TSH: 0.523 u[IU]/mL (ref 0.450–4.500)

## 2019-10-03 LAB — HEPATITIS C ANTIBODY: Hep C Virus Ab: <0.1 s/co ratio (ref 0.0–0.9)

## 2019-10-03 NOTE — Telephone Encounter (Signed)
Second call placed to patient to review benefit and schedule recommended ultrasound with Dr Talbert Nan. Left voicemail message requesting a return call

## 2019-10-04 LAB — CHLAMYDIA/GONOCOCCUS/TRICHOMONAS, NAA
Chlamydia by NAA: POSITIVE — AB
Gonococcus by NAA: NEGATIVE
Trich vag by NAA: NEGATIVE

## 2019-10-07 ENCOUNTER — Telehealth: Payer: Self-pay | Admitting: *Deleted

## 2019-10-07 DIAGNOSIS — D729 Disorder of white blood cells, unspecified: Secondary | ICD-10-CM

## 2019-10-07 NOTE — Telephone Encounter (Addendum)
-----   Message from Yates Decamp, [place] sent at 10/07/2019  9:59 AM EST ----- Please inform patient  Notes recorded by Salvadore Dom, MD on 10/06/2019 at 2:21 PM EST  Please inform the patient that she has chlamydia and treat with azithromycin 1 gram po x 1. Please offer expedited partner therapy. They should avoid intercourse until one week after they have both been treated, then use condoms. She needs a f/u cervical culture in 3 months.  She needs to be one week s/p treatment prior to IUD insertion and have a normal exam (which she did last week, other than tenderness from known left ovarian cyst).

## 2019-10-07 NOTE — Telephone Encounter (Signed)
Spoke with patient in regards to benefit for recommended ultrasound. Patient acknowledges  understanding of information presented. Patient is scheduled 10/08/2019 with Dr Talbert Nan. Patient is aware of appointment date, arrival time and cancellation policy. No further questions. Will close encounter

## 2019-10-07 NOTE — Telephone Encounter (Addendum)
Left message to call Sharee Pimple, RN at [facility] [phone].      ---- Message from Yates Decamp, [place] sent at 10/07/2019  9:58 AM EST ----- Could you please inform patient  Notes recorded by Salvadore Dom, MD on 10/03/2019 at 1:44 PM EDT  Please let the patient know that her calcium is slightly elevated and her WBC is low. She is having issues with nausea and weight loss. I have referred her to see Dr Carlean Purl. She also needs a primary. Can you get her in to see someone.  The rest of her blood work is normal. Her cervical cultures are pending.

## 2019-10-08 ENCOUNTER — Other Ambulatory Visit: Payer: BC Managed Care – PPO

## 2019-10-08 ENCOUNTER — Other Ambulatory Visit: Payer: Self-pay

## 2019-10-08 ENCOUNTER — Ambulatory Visit: Payer: BC Managed Care – PPO | Admitting: Obstetrics and Gynecology

## 2019-10-08 ENCOUNTER — Other Ambulatory Visit: Payer: BC Managed Care – PPO | Admitting: Obstetrics and Gynecology

## 2019-10-08 ENCOUNTER — Ambulatory Visit (INDEPENDENT_AMBULATORY_CARE_PROVIDER_SITE_OTHER): Payer: BC Managed Care – PPO

## 2019-10-08 ENCOUNTER — Encounter: Payer: Self-pay | Admitting: Obstetrics and Gynecology

## 2019-10-08 VITALS — BP 110/60 | HR 76 | Temp 97.5°F | Ht 60.0 in | Wt 96.4 lb

## 2019-10-08 DIAGNOSIS — N83201 Unspecified ovarian cyst, right side: Secondary | ICD-10-CM

## 2019-10-08 DIAGNOSIS — N83202 Unspecified ovarian cyst, left side: Secondary | ICD-10-CM

## 2019-10-08 DIAGNOSIS — D72819 Decreased white blood cell count, unspecified: Secondary | ICD-10-CM

## 2019-10-08 DIAGNOSIS — A749 Chlamydial infection, unspecified: Secondary | ICD-10-CM | POA: Diagnosis not present

## 2019-10-08 MED ORDER — AZITHROMYCIN 500 MG PO TABS: ORAL_TABLET | ORAL | 0 refills | Status: DC

## 2019-10-08 NOTE — Progress Notes (Signed)
GYNECOLOGY  VISIT   HPI: 18 y.o.   Single Black or African American Not Hispanic or Latino  female   [id number] with Patient's last menstrual period was 09/13/2019 (exact date).   here for further evaluation of LLQ abdominal pain and h/o left ovarian cyst (incidental finding on CT a month ago).   Exam last week only significant for left adnexal tenderness and an increase in vaginal d/c. BV identified on vaginal slides, the patient was treated with metrogel. Lab testing from last week returned with a low WBC, mildly elevated calcium and positive for chlamydia. Messages were left for the patient, but she hadn't had a chance to call back yet. On questioning the patient states that her LLQ pain has resolved, she notes mild tenderness in her RLQ. Nothing significant.   GYNECOLOGIC HISTORY: Patient's last menstrual period was 09/13/2019 (exact date). Contraception: none Menopausal hormone therapy: no        OB History    Gravida  0   Para  0   Term  0   Preterm  0   AB  0   Living  0     SAB  0   TAB  0   Ectopic  0   Multiple  0   Live Births  0              Patient Active Problem List   Diagnosis Date Noted  . Migraine with aura     Past Medical History:  Diagnosis Date  . Migraine with aura     Past Surgical History:  Procedure Laterality Date  . MOUTH SURGERY    . UMBILICAL HERNIA REPAIR      Current Outpatient Medications  Medication Sig Dispense Refill  . famotidine (PEPCID) 20 MG tablet Take 1 tablet (20 mg total) by mouth 2 (two) times daily. 30 tablet 0  . metroNIDAZOLE (METROGEL) 0.75 % vaginal gel Place 1 Applicatorful vaginally at bedtime. Use for 5 nights. 70 g 0  . ondansetron (ZOFRAN ODT) 4 MG disintegrating tablet 4mg  ODT q4 hours prn nausea/vomit 10 tablet 0   No current facility-administered medications for this visit.      ALLERGIES: Patient has no known allergies.  No family history on file.  Social History   Socioeconomic History   . Marital status: Single    Spouse name: Not on file  . Number of children: Not on file  . Years of education: Not on file  . Highest education level: Not on file  Occupational History  . Not on file  Social Needs  . Financial resource strain: Not on file  . Food insecurity    Worry: Not on file    Inability: Not on file  . Transportation needs    Medical: Not on file    Non-medical: Not on file  Tobacco Use  . Smoking status: Never Smoker  . Smokeless tobacco: Never Used  Substance and Sexual Activity  . Alcohol use: No  . Drug use: Yes    Types: Marijuana  . Sexual activity: Yes    Birth control/protection: Condom  Lifestyle  . Physical activity    Days per week: Not on file    Minutes per session: Not on file  . Stress: Not on file  Relationships  . Social Herbalist on phone: Not on file    Gets together: Not on file    Attends religious service: Not on file    Active member of  club or organization: Not on file    Attends meetings of clubs or organizations: Not on file    Relationship status: Not on file  . Intimate partner violence    Fear of current or ex partner: Not on file    Emotionally abused: Not on file    Physically abused: Not on file    Forced sexual activity: Not on file  Other Topics Concern  . Not on file  Social History Narrative  . Not on file    ROS  PHYSICAL EXAMINATION:    LMP 09/13/2019 (Exact Date)     General appearance: alert, cooperative and appears stated age Abdomen: soft, non-tender; non distended, no masses,  no organomegaly  Ultrasound images reviewed with the patient. The prior left ovarian cyst is resolving, she now has a large complex right ovarian cyst, c/w a hemorrhagic cyst. Ultrasound is otherwise normal.   ASSESSMENT Left ovarian cyst resolving Now with 4.6 cm complex right ovarian cyst, most c/w hemorrhagic CL Chlamydia on screening from last week. She has been with the same partner x 10 months, not  sure when she was last tested.  Contraception, desires a mirena IUD    PLAN Azithromycin, 1 gram po x 1 now Expedited partner treatment given No intercourse for one week, then use condoms She is due for her cycle soon, will return for mirena IUD insertion 1 week after treatment for chlamydia and after her cycle starts She will need retesting for chlamydia in 3 months  Discussed risk of ectopic pregnancy Plan repeat pelvic ultrasound in 2 months.    An After Visit Summary was printed and given to the patient.  ~15 minutes face to face time of which over 50% was spent in counseling.

## 2019-10-08 NOTE — Patient Instructions (Addendum)
Chlamydia, Female Chlamydia is an STD (sexually transmitted disease). It is a bacterial infection that spreads (is contagious) through sexual contact. Chlamydia can occur in different areas of the body, including:  The tube that moves urine from the bladder out of the body (urethra).  The lower part of the uterus (cervix).  The throat.  The rectum. This condition is not difficult to treat. However, if left untreated, chlamydia can lead to more serious health problems, including pelvic inflammatory disorder (PID). PID can increase your risk of not being able to have children (sterility). Also, if chlamydia is left untreated and you are pregnant or become pregnant, there is a chance that your baby can become infected during delivery. This may cause serious health problems for the baby. What are the causes? Chlamydia is caused by the bacteria Chlamydia trachomatis. It is passed from an infected partner during sexual activity. Chlamydia can spread through contact with the genitals, mouth, or rectum. What are the signs or symptoms? In some cases, there may not be any symptoms for this condition (asymptomatic), especially early in the infection. If symptoms develop, they may include:  Burning with urination.  Frequent urination.  Vaginal discharge.  Redness, soreness, and swelling (inflammation) of the rectum.  Bleeding or discharge from the rectum.  Abdominal pain.  Pain during sexual intercourse.  Bleeding between menstrual periods.  Itching, burning, or redness in the eyes, or discharge from the eyes. How is this diagnosed? This condition may be diagnosed with:  Urine tests.  Swab tests. Depending on your symptoms, your health care provider may use a cotton swab to collect discharge from your vagina or rectum to test for the bacteria.  A pelvic exam. How is this treated? This condition is treated with antibiotic medicines. If you are pregnant, certain types of antibiotics will  need to be avoided. Follow these instructions at home: Medicines  Take over-the-counter and prescription medicines only as told by your health care provider.  Take your antibiotic medicine as told by your health care provider. Do not stop taking the antibiotic even if you start to feel better. Sexual activity  Tell sexual partners about your infection. This includes any oral, anal, or vaginal sex partners you have had within 60 days of when your symptoms started. Sexual partners should also be treated, even if they have no signs of the disease.  Do not have sex until you and your sexual partners have completed treatment and your health care provider says it is okay. If your health care provider prescribed you a single dose treatment, wait 7 days after taking the treatment before having sex. General instructions  It is your responsibility to get your test results. Ask your health care provider, or the department performing the test, when your results will be ready.  Get plenty of rest.  Eat a healthy, well-balanced diet.  Drink enough fluids to keep your urine clear or pale yellow.  Keep all follow-up visits as told by your health care provider. This is important. You may need to be tested for infection again 3 months after treatment. How is this prevented? The only sure way to prevent chlamydia is to avoid having sex. However, you can lower your risk by:  Using latex condoms correctly every time you have sex.  Not having multiple sexual partners.  Asking if your sexual partner has been tested for STIs and had negative results. Contact a health care provider if:  You develop new symptoms or your symptoms do not get   better after completing treatment.  You have a fever or chills.  You have pain during sexual intercourse. Get help right away if:  Your pain gets worse and does not get better with medicine.  You develop flu-like symptoms, such as night sweats, sore throat, or  muscle aches.  You experience nausea or vomiting.  You have difficulty swallowing.  You have bleeding between periods or after sex.  You have irregular menstrual periods.  You have abdominal or lower back pain that does not get better with medicine.  You feel weak or dizzy, or you faint.  You are pregnant and you develop symptoms of chlamydia. Summary  Chlamydia is an STD (sexually transmitted disease). It is a bacterial infection that spreads (is contagious) through sexual contact.  This condition is not difficult to treat, however. If left untreated, chlamydia can lead to more serious health problems, including pelvic inflammatory disease (PID).  In some cases, there may not be any symptoms for this condition (asymptomatic).  This condition is treated with antibiotic medicines.  Using latex condoms correctly every time you have sex can help prevent chlamydia. This information is not intended to replace advice given to you by your health care provider. Make sure you discuss any questions you have with your health care provider. Document Released: 08/31/2005 Document Revised: 05/15/2018 Document Reviewed: 11/07/2016 Elsevier Patient Education  [employer].  Ovarian Cyst     An ovarian cyst is a fluid-filled sac that forms on an ovary. The ovaries are small organs that produce eggs in women. Various types of cysts can form on the ovaries. Some may cause symptoms and require treatment. Most ovarian cysts go away on their own, are not cancerous (are benign), and do not cause problems. Common types of ovarian cysts include:  Functional (follicle) cysts. ? Occur during the menstrual cycle, and usually go away with the next menstrual cycle if you do not get pregnant. ? Usually cause no symptoms.  Endometriomas. ? Are cysts that form from the tissue that lines the uterus (endometrium). ? Are sometimes called "chocolate cysts" because they become filled with blood that turns  brown. ? Can cause pain in the lower abdomen during intercourse and during your period.  Cystadenoma cysts. ? Develop from cells on the outside surface of the ovary. ? Can get very large and cause lower abdomen pain and pain with intercourse. ? Can cause severe pain if they twist or break open (rupture).  Dermoid cysts. ? Are sometimes found in both ovaries. ? May contain different kinds of body tissue, such as skin, teeth, hair, or cartilage. ? Usually do not cause symptoms unless they get very big.  Theca lutein cysts. ? Occur when too much of a certain hormone (human chorionic gonadotropin) is produced and overstimulates the ovaries to produce an egg. ? Are most common after having procedures used to assist with the conception of a baby (in vitro fertilization). What are the causes? Ovarian cysts may be caused by:  Ovarian hyperstimulation syndrome. This is a condition that can develop from taking fertility medicines. It causes multiple large ovarian cysts to form.  Polycystic ovarian syndrome (PCOS). This is a common hormonal disorder that can cause ovarian cysts, as well as problems with your period or fertility. What increases the risk? The following factors may make you more likely to develop ovarian cysts:  Being overweight or obese.  Taking fertility medicines.  Taking certain forms of hormonal birth control.  Smoking. What are the signs or  symptoms? Many ovarian cysts do not cause symptoms. If symptoms are present, they may include:  Pelvic pain or pressure.  Pain in the lower abdomen.  Pain during sex.  Abdominal swelling.  Abnormal menstrual periods.  Increasing pain with menstrual periods. How is this diagnosed? These cysts are commonly found during a routine pelvic exam. You may have tests to find out more about the cyst, such as:  Ultrasound.  X-ray of the pelvis.  CT scan.  MRI.  Blood tests. How is this treated? Many ovarian cysts go away on  their own without treatment. Your health care provider may want to check your cyst regularly for 2-3 months to see if it changes. If you are in menopause, it is especially important to have your cyst monitored closely because menopausal women have a higher rate of ovarian cancer. When treatment is needed, it may include:  Medicines to help relieve pain.  A procedure to drain the cyst (aspiration).  Surgery to remove the whole cyst.  Hormone treatment or birth control pills. These methods are sometimes used to help dissolve a cyst. Follow these instructions at home:  Take over-the-counter and prescription medicines only as told by your health care provider.  Do not drive or use heavy machinery while taking prescription pain medicine.  Get regular pelvic exams and Pap tests as often as told by your health care provider.  Return to your normal activities as told by your health care provider. Ask your health care provider what activities are safe for you.  Do not use any products that contain nicotine or tobacco, such as cigarettes and e-cigarettes. If you need help quitting, ask your health care provider.  Keep all follow-up visits as told by your health care provider. This is important. Contact a health care provider if:  Your periods are late, irregular, or painful, or they stop.  You have pelvic pain that does not go away.  You have pressure on your bladder or trouble emptying your bladder completely.  You have pain during sex.  You have any of the following in your abdomen: ? A feeling of fullness. ? Pressure. ? Discomfort. ? Pain that does not go away. ? Swelling.  You feel generally ill.  You become constipated.  You lose your appetite.  You develop severe acne.  You start to have more body hair and facial hair.  You are gaining weight or losing weight without changing your exercise and eating habits.  You think you may be pregnant. Get help right away if:   You have abdominal pain that is severe or gets worse.  You cannot eat or drink without vomiting.  You suddenly develop a fever.  Your menstrual period is much heavier than usual. This information is not intended to replace advice given to you by your health care provider. Make sure you discuss any questions you have with your health care provider. Document Released: 11/21/2005 Document Revised: 02/19/2018 Document Reviewed: 04/24/2016 Elsevier Patient Education  2020 [employer].

## 2019-10-09 NOTE — Telephone Encounter (Signed)
Spoke with patient. Patient was notified of cervical cultures during OV on 10/08/19, Rx sent.   Reviewed blood work with patient, patient request assistance with establishing care with PCP. Reviewed PCP options, will place referral to [facility]. Patient is aware she will be contacted by referral coordinator with appt details once scheduled.   TOC scheduled for 01/13/20 at 1pm with Dr. Talbert Nan.   Patient will return call to office on first day of menses to schedule IUD insertion.   Patient verbalizes understanding and is agreeable.    Confidential communicable disease report completed and faxed to [facility].    Encounter closed.

## 2019-10-14 ENCOUNTER — Telehealth: Payer: Self-pay

## 2019-10-14 NOTE — Telephone Encounter (Signed)
Christina Dom, MD  [place], Harley Hallmark, RN        I forgot to tell this patient that she needs repeat testing for chlamydia in 3 months. Can you please inform her (treatment given yesterday). If she does get the mirena, she could potentially do the testing early at her IUD check. It needs to be at least 3 weeks after she finishes the treatment.  Thanks,  Jill    Left message to call Verline Lema at [phone].

## 2019-10-17 NOTE — Telephone Encounter (Signed)
Left message to call Kaitlyn at [phone]. 

## 2019-10-23 ENCOUNTER — Ambulatory Visit: Payer: BC Managed Care – PPO | Admitting: Family Medicine

## 2019-10-23 ENCOUNTER — Other Ambulatory Visit: Payer: Self-pay

## 2019-10-23 ENCOUNTER — Encounter: Payer: Self-pay | Admitting: Family Medicine

## 2019-10-23 ENCOUNTER — Ambulatory Visit (INDEPENDENT_AMBULATORY_CARE_PROVIDER_SITE_OTHER): Payer: BC Managed Care – PPO

## 2019-10-23 VITALS — BP 110/70 | Ht 60.0 in | Wt 96.0 lb

## 2019-10-23 DIAGNOSIS — R634 Abnormal weight loss: Secondary | ICD-10-CM

## 2019-10-23 DIAGNOSIS — Z7689 Persons encountering health services in other specified circumstances: Secondary | ICD-10-CM | POA: Diagnosis not present

## 2019-10-23 DIAGNOSIS — R110 Nausea: Secondary | ICD-10-CM

## 2019-10-23 DIAGNOSIS — R1030 Lower abdominal pain, unspecified: Secondary | ICD-10-CM | POA: Diagnosis not present

## 2019-10-23 MED ORDER — FAMOTIDINE 20 MG PO TABS: 20.0000 mg | ORAL_TABLET | Freq: Two times a day (BID) | ORAL | 0 refills | Status: DC

## 2019-10-23 MED ORDER — DICYCLOMINE HCL 10 MG PO CAPS: 10.0000 mg | ORAL_CAPSULE | Freq: Three times a day (TID) | ORAL | 0 refills | Status: DC

## 2019-10-23 NOTE — Progress Notes (Signed)
Christina Mays is a 18 y.o. female  Chief Complaint  Patient presents with  . Establish Care  . Weight Loss    HPI: Christina Mays is a 18 y.o. female here as a new patient to establish care with our office. She complains of unintentional weight loss. On review of chart, pt has lost approximately 18lbs since 05/2019.  Pt endorses decreased appetite, lower abdominal "cramping discomfort" that is present on a daily basis but worse first thing in the AM. Pt endorses nausea with rare vomiting. No diarrhea or constipation. Pt reports daily BM, normal volume. No fever, chills. No night sweats. No fatigue. No blood in stool. She states she saw "little red pieces" in her vomitus on 1 occasion 7-10 days ago.  Typically eats around 2-3pm each day  She states she has always been a "small portion eater". She also admits to being a "picky eater". She eats beef, seafood, chicken, some fruits (watermelon, strawberries, pineapple) and some vegetables, almond milk, cheese. Favorite food is fried chicken and french fries. She gets take-out food 1-2x/day or 14x/wk. She eats 1/4-1/2 of each meal.  She takes PRN zofran which helps but she states she often waits to take it until she feels like she won't throw up. She was given pepcid 20mg  but only took 1 tab.  No urinary symptoms. No GYN symptoms. Pt does have h/o ovarian cyst/  Past Medical History:  Diagnosis Date  . Migraine with aura     Past Surgical History:  Procedure Laterality Date  . MOUTH SURGERY    . UMBILICAL HERNIA REPAIR      Social History   Socioeconomic History  . Marital status: Single    Spouse name: Not on file  . Number of children: Not on file  . Years of education: Not on file  . Highest education level: Not on file  Occupational History  . Not on file  Social Needs  . Financial resource strain: Not on file  . Food insecurity    Worry: Not on file    Inability: Not on file  . Transportation needs    Medical: Not on file   Non-medical: Not on file  Tobacco Use  . Smoking status: Never Smoker  . Smokeless tobacco: Never Used  Substance and Sexual Activity  . Alcohol use: No  . Drug use: Yes    Types: Marijuana  . Sexual activity: Yes    Birth control/protection: Condom  Lifestyle  . Physical activity    Days per week: Not on file    Minutes per session: Not on file  . Stress: Not on file  Relationships  . Social Herbalist on phone: Not on file    Gets together: Not on file    Attends religious service: Not on file    Active member of club or organization: Not on file    Attends meetings of clubs or organizations: Not on file    Relationship status: Not on file  . Intimate partner violence    Fear of current or ex partner: Not on file    Emotionally abused: Not on file    Physically abused: Not on file    Forced sexual activity: Not on file  Other Topics Concern  . Not on file  Social History Narrative  . Not on file    History reviewed. No pertinent family history.   Immunization History  Administered Date(s) Administered  . HPV 9-valent 10/02/2019  . PPD  Test 01/06/2019    Outpatient Encounter Medications as of 10/23/2019  Medication Sig  . [DISCONTINUED] azithromycin (ZITHROMAX) 500 MG tablet 2 tablets po x 1  . [DISCONTINUED] famotidine (PEPCID) 20 MG tablet Take 1 tablet (20 mg total) by mouth 2 (two) times daily.  . [DISCONTINUED] metroNIDAZOLE (METROGEL) 0.75 % vaginal gel Place 1 Applicatorful vaginally at bedtime. Use for 5 nights.  . [DISCONTINUED] ondansetron (ZOFRAN ODT) 4 MG disintegrating tablet 4mg  ODT q4 hours prn nausea/vomit   No facility-administered encounter medications on file as of 10/23/2019.      ROS: Pertinent positives and negatives noted in HPI. Remainder of ROS non-contributory  No Known Allergies  BP 110/70   Ht 5' (1.524 m)   Wt 96 lb (43.5 kg)   BMI 18.75 kg/m   Wt Readings from Last 3 Encounters:  10/23/19 96 lb (43.5 kg) (1 %, Z=  -2.18)*  10/08/19 96 lb 6.4 oz (43.7 kg) (2 %, Z= -2.14)*  10/02/19 96 lb 12.8 oz (43.9 kg) (2 %, Z= -2.10)*   * Growth percentiles are based on CDC (Girls, 2-20 Years) data.  09/05/19 - 45.4kg (99.9lbs) 05/24/19 - 52.5kg (114.4lbs)   Physical Exam  Constitutional: She is oriented to person, place, and time. She appears well-developed and well-nourished. No distress.  Cardiovascular: Normal rate, regular rhythm and normal heart sounds.  Pulmonary/Chest: Effort normal and breath sounds normal.  Abdominal: Soft. Bowel sounds are normal. She exhibits no distension. There is abdominal tenderness (Lower abdominal TTP w/o rebound or guarding). There is no rebound and no guarding.  Neurological: She is alert and oriented to person, place, and time.  Psychiatric: She has a normal mood and affect. Her behavior is normal.     A/P:  1. Encounter to establish care with new doctor  2. Unintended weight loss 3. Nausea 4. Lower abdominal pain - Hemoglobin A1c - TSH - C-reactive protein - Sedimentation rate - CBC w/Diff - Basic metabolic panel - Celiac Panel - H Pylori, IGM, IGG, IGA AB - DG Chest 2 View Rx: - famotidine (PEPCID) 20 MG tablet; Take 1 tablet (20 mg total) by mouth 2 (two) times daily.  Dispense: 60 tablet; Refill: 0 - dicyclomine (BENTYL) 10 MG capsule; Take 1 capsule (10 mg total) by mouth 3 (three) times daily before meals.  Dispense: 60 capsule; Refill: 0 - will contact pt with results and plan of VV for f/u in 2 wks - if no/minimal improvement, will refer to GI Discussed plan and reviewed medications with patient, including risks, benefits, and potential side effects. Pt expressed understand. All questions answered.  I personally spent 30 min with the patient today and greater than 50% was spent in counseling, coordination of care, education

## 2019-10-24 LAB — CBC WITH DIFFERENTIAL/PLATELET
Basophils Absolute: 0 10*3/uL (ref 0.0–0.1)
Basophils Relative: 0.6 % (ref 0.0–3.0)
Eosinophils Absolute: 0 10*3/uL (ref 0.0–0.7)
Eosinophils Relative: 1.4 % (ref 0.0–5.0)
HCT: 36.7 % (ref 36.0–49.0)
Hemoglobin: 11.9 g/dL — ABNORMAL LOW (ref 12.0–16.0)
Lymphocytes Relative: 56.3 % — ABNORMAL HIGH (ref 24.0–48.0)
Lymphs Abs: 1.8 10*3/uL (ref 0.7–4.0)
MCHC: 32.4 g/dL (ref 31.0–37.0)
MCV: 84.9 fl (ref 78.0–98.0)
Monocytes Absolute: 0.2 10*3/uL (ref 0.1–1.0)
Monocytes Relative: 6.7 % (ref 3.0–12.0)
Neutro Abs: 1.1 10*3/uL — ABNORMAL LOW (ref 1.4–7.7)
Neutrophils Relative %: 35 % — ABNORMAL LOW (ref 43.0–71.0)
Platelets: 319 10*3/uL (ref 150.0–575.0)
RBC: 4.33 Mil/uL (ref 3.80–5.70)
RDW: 16.1 % — ABNORMAL HIGH (ref 11.4–15.5)
WBC: 3.3 10*3/uL — ABNORMAL LOW (ref 4.5–13.5)

## 2019-10-24 LAB — BASIC METABOLIC PANEL
BUN: 9 mg/dL (ref 6–23)
CO2: 24 mEq/L (ref 19–32)
Calcium: 9.6 mg/dL (ref 8.4–10.5)
Chloride: 106 mEq/L (ref 96–112)
Creatinine, Ser: 0.64 mg/dL (ref 0.40–1.20)
GFR: 143.67 mL/min (ref >60.00)
Glucose, Bld: 80 mg/dL (ref 70–99)
Potassium: 4 mEq/L (ref 3.5–5.1)
Sodium: 138 mEq/L (ref 135–145)

## 2019-10-25 LAB — SEDIMENTATION RATE: Sed Rate: 14 mm/hr (ref 0–32)

## 2019-10-25 LAB — TSH: TSH: 0.514 u[IU]/mL (ref 0.450–4.500)

## 2019-10-25 LAB — GLIA (IGA/G) + TTG IGA
Antigliadin Abs, IgA: 4 units (ref 0–19)
Gliadin IgG: 5 units (ref 0–19)
Transglutaminase IgA: <2 U/mL (ref 0–3)

## 2019-10-25 LAB — H PYLORI, IGM, IGG, IGA AB
H pylori, IgM Abs: <9 units (ref 0.0–8.9)
H. pylori, IgA Abs: <9 units (ref 0.0–8.9)
H. pylori, IgG AbS: 0.2 Index Value (ref 0.00–0.79)

## 2019-10-25 LAB — HEMOGLOBIN A1C
Est. average glucose Bld gHb Est-mCnc: 94 mg/dL
Hgb A1c MFr Bld: 4.9 % (ref 4.8–5.6)

## 2019-10-25 LAB — C-REACTIVE PROTEIN: CRP: <1 mg/L (ref 0–10)

## 2019-10-30 ENCOUNTER — Other Ambulatory Visit: Payer: Self-pay | Admitting: Family Medicine

## 2019-10-30 DIAGNOSIS — R1030 Lower abdominal pain, unspecified: Secondary | ICD-10-CM

## 2019-10-30 DIAGNOSIS — R634 Abnormal weight loss: Secondary | ICD-10-CM

## 2019-10-30 DIAGNOSIS — R110 Nausea: Secondary | ICD-10-CM

## 2019-11-27 NOTE — Telephone Encounter (Signed)
Patient is scheduled for 3 month TOC on 01/13/2020. Encounter closed.

## 2019-12-09 ENCOUNTER — Other Ambulatory Visit: Payer: Self-pay | Admitting: Family Medicine

## 2019-12-09 DIAGNOSIS — R634 Abnormal weight loss: Secondary | ICD-10-CM

## 2019-12-11 ENCOUNTER — Telehealth: Payer: Self-pay | Admitting: Obstetrics and Gynecology

## 2019-12-11 NOTE — Telephone Encounter (Signed)
Call placed to patient to review benefit for follow up ultrasound. Patient acknowledges understanding of information presented. Patient is scheduled for 12/17/2019 with Dr Oscar La. Patient is aware of the appointment date, arrival time and cancellation policy. No further questions. Will close encounter.

## 2019-12-16 ENCOUNTER — Other Ambulatory Visit: Payer: Self-pay

## 2019-12-17 ENCOUNTER — Ambulatory Visit (INDEPENDENT_AMBULATORY_CARE_PROVIDER_SITE_OTHER): Payer: BC Managed Care – PPO

## 2019-12-17 ENCOUNTER — Ambulatory Visit: Payer: BC Managed Care – PPO | Admitting: Obstetrics and Gynecology

## 2019-12-17 ENCOUNTER — Encounter: Payer: Self-pay | Admitting: Obstetrics and Gynecology

## 2019-12-17 VITALS — BP 108/68 | HR 76 | Temp 98.5°F | Ht 60.0 in | Wt 100.2 lb

## 2019-12-17 DIAGNOSIS — N83201 Unspecified ovarian cyst, right side: Secondary | ICD-10-CM

## 2019-12-17 NOTE — Progress Notes (Signed)
GYNECOLOGY  VISIT   HPI: 19 y.o.   Single Black or African American Not Hispanic or Latino  female   G0P0000 with last Mensutral period was 11/16/19-11/19/19  here for   Pelvic ultrasound due to cyst on right ovary.  She was originally evaluated in 10/20 for LLQ abdominal pain and was noted to have a left ovarian cyst. Ultrasound on 10/08/19 showed a 4.6 cm complex right ovarian cyst most c/w a hemorrhagic CL.  She has occasional pain in the right pelvis/absomen, tolerable and improved over the last couple of months.  She has a h/o migraines with aura, not a candidate for OCP's.  Using condoms for contraception.   GYNECOLOGIC HISTORY: No LMP recorded. Contraception:condom  Menopausal hormone therapy: none        OB History    Gravida  0   Para  0   Term  0   Preterm  0   AB  0   Living  0     SAB  0   TAB  0   Ectopic  0   Multiple  0   Live Births  0              Patient Active Problem List   Diagnosis Date Noted  . Migraine with aura     Past Medical History:  Diagnosis Date  . Migraine with aura     Past Surgical History:  Procedure Laterality Date  . MOUTH SURGERY    . UMBILICAL HERNIA REPAIR      Current Outpatient Medications  Medication Sig Dispense Refill  . dicyclomine (BENTYL) 10 MG capsule Take 1 capsule (10 mg total) by mouth 3 (three) times daily before meals. 60 capsule 0  . famotidine (PEPCID) 20 MG tablet TAKE 1 TABLET BY MOUTH TWICE A DAY 60 tablet 0   No current facility-administered medications for this visit.     ALLERGIES: Patient has no known allergies.  History reviewed. No pertinent family history.  Social History   Socioeconomic History  . Marital status: Single    Spouse name: Not on file  . Number of children: Not on file  . Years of education: Not on file  . Highest education level: Not on file  Occupational History  . Not on file  Tobacco Use  . Smoking status: Never Smoker  . Smokeless tobacco: Never  Used  Substance and Sexual Activity  . Alcohol use: No  . Drug use: Yes    Types: Marijuana  . Sexual activity: Yes    Birth control/protection: Condom  Other Topics Concern  . Not on file  Social History Narrative  . Not on file   Social Determinants of Health   Financial Resource Strain:   . Difficulty of Paying Living Expenses: Not on file  Food Insecurity:   . Worried About Charity fundraiser in the Last Year: Not on file  . Ran Out of Food in the Last Year: Not on file  Transportation Needs:   . Lack of Transportation (Medical): Not on file  . Lack of Transportation (Non-Medical): Not on file  Physical Activity:   . Days of Exercise per Week: Not on file  . Minutes of Exercise per Session: Not on file  Stress:   . Feeling of Stress : Not on file  Social Connections:   . Frequency of Communication with Friends and Family: Not on file  . Frequency of Social Gatherings with Friends and Family: Not on file  .  Attends Religious Services: Not on file  . Active Member of Clubs or Organizations: Not on file  . Attends Banker Meetings: Not on file  . Marital Status: Not on file  Intimate Partner Violence:   . Fear of Current or Ex-Partner: Not on file  . Emotionally Abused: Not on file  . Physically Abused: Not on file  . Sexually Abused: Not on file    Review of Systems  All other systems reviewed and are negative.   PHYSICAL EXAMINATION:    BP 108/68   Pulse 76   Temp 98.5 F (36.9 C)   Ht 5' (1.524 m)   Wt 100 lb 3.2 oz (45.5 kg)   SpO2 97%   BMI 19.57 kg/m     General appearance: alert, cooperative and appears stated age   Ultrasound images reviewed with the patient. She has a complex right ovarian cyst 3.6 cm in max diameter. In 11/20 she had a complex right ovarian cyst that was a max of 4.6 cm   ASSESSMENT Complex right ovarian cyst, unclear if this is a new cyst or the same cyst that is minimally smaller from 11/20. Not a candidate  for OCP's    PLAN F/U ultrasound in 3 months   An After Visit Summary was printed and given to the patient.

## 2019-12-18 ENCOUNTER — Telehealth: Payer: Self-pay | Admitting: Obstetrics and Gynecology

## 2019-12-18 NOTE — Telephone Encounter (Signed)
Call placed to patient to scheduled three month follow up ultrasound, which will be due on 03/17/2020. Left voicemail message requesting a return call

## 2020-01-10 ENCOUNTER — Other Ambulatory Visit: Payer: Self-pay

## 2020-01-13 ENCOUNTER — Ambulatory Visit: Payer: BC Managed Care – PPO | Admitting: Obstetrics and Gynecology

## 2020-01-13 ENCOUNTER — Encounter: Payer: Self-pay | Admitting: Obstetrics and Gynecology

## 2020-01-13 ENCOUNTER — Other Ambulatory Visit: Payer: Self-pay

## 2020-01-13 VITALS — BP 110/62 | HR 73 | Temp 98.3°F | Ht 60.0 in | Wt 102.0 lb

## 2020-01-13 DIAGNOSIS — N898 Other specified noninflammatory disorders of vagina: Secondary | ICD-10-CM

## 2020-01-13 DIAGNOSIS — Z3009 Encounter for other general counseling and advice on contraception: Secondary | ICD-10-CM

## 2020-01-13 DIAGNOSIS — Z113 Encounter for screening for infections with a predominantly sexual mode of transmission: Secondary | ICD-10-CM

## 2020-01-13 DIAGNOSIS — Z8619 Personal history of other infectious and parasitic diseases: Secondary | ICD-10-CM

## 2020-01-13 NOTE — Progress Notes (Signed)
GYNECOLOGY  VISIT   HPI: 19 y.o.   Single Black or African American Not Hispanic or Latino  female   G0P0000 with No LMP recorded.   here for   Test of cure from positive chlamydia. She and her partner were treated in 11/20. Other STD testing was negative. She is with the same partner, not consistently using condoms.  She is interested in an IUD.  Menses q month x 1-4 days. She can saturate an ultra tampon in up to 1 hour at times. She c/o severe cramps for one day, then tolerable.  Patient states that she is feeling a lot better, had pain from an ovarian cyst last fall. She had a f/u ultrasound last month, had a smaller ovarian cyst and just occasional pain at that time. Very rare pain at this time.  She denies vaginal d/c.   GYNECOLOGIC HISTORY: No LMP recorded.LMP 12/20/19 Contraception: condoms sometimes  Menopausal hormone therapy: none         OB History    Gravida  0   Para  0   Term  0   Preterm  0   AB  0   Living  0     SAB  0   TAB  0   Ectopic  0   Multiple  0   Live Births  0              Patient Active Problem List   Diagnosis Date Noted  . Migraine with aura     Past Medical History:  Diagnosis Date  . Migraine with aura     Past Surgical History:  Procedure Laterality Date  . MOUTH SURGERY    . UMBILICAL HERNIA REPAIR      Current Outpatient Medications  Medication Sig Dispense Refill  . dicyclomine (BENTYL) 10 MG capsule Take 1 capsule (10 mg total) by mouth 3 (three) times daily before meals. 60 capsule 0  . famotidine (PEPCID) 20 MG tablet TAKE 1 TABLET BY MOUTH TWICE A DAY 60 tablet 0   No current facility-administered medications for this visit.     ALLERGIES: Patient has no known allergies.  No family history on file.  Social History   Socioeconomic History  . Marital status: Single    Spouse name: Not on file  . Number of children: Not on file  . Years of education: Not on file  . Highest education level: Not on  file  Occupational History  . Not on file  Tobacco Use  . Smoking status: Never Smoker  . Smokeless tobacco: Never Used  Substance and Sexual Activity  . Alcohol use: No  . Drug use: Yes    Types: Marijuana  . Sexual activity: Yes    Birth control/protection: Condom  Other Topics Concern  . Not on file  Social History Narrative  . Not on file   Social Determinants of Health   Financial Resource Strain:   . Difficulty of Paying Living Expenses: Not on file  Food Insecurity:   . Worried About Charity fundraiser in the Last Year: Not on file  . Ran Out of Food in the Last Year: Not on file  Transportation Needs:   . Lack of Transportation (Medical): Not on file  . Lack of Transportation (Non-Medical): Not on file  Physical Activity:   . Days of Exercise per Week: Not on file  . Minutes of Exercise per Session: Not on file  Stress:   . Feeling of  Stress : Not on file  Social Connections:   . Frequency of Communication with Friends and Family: Not on file  . Frequency of Social Gatherings with Friends and Family: Not on file  . Attends Religious Services: Not on file  . Active Member of Clubs or Organizations: Not on file  . Attends Banker Meetings: Not on file  . Marital Status: Not on file  Intimate Partner Violence:   . Fear of Current or Ex-Partner: Not on file  . Emotionally Abused: Not on file  . Physically Abused: Not on file  . Sexually Abused: Not on file    Review of Systems  All other systems reviewed and are negative.   PHYSICAL EXAMINATION:    BP 110/62   Pulse 73   Temp 98.3 F (36.8 C)   Ht 5' (1.524 m)   Wt 102 lb (46.3 kg)   SpO2 99%   BMI 19.92 kg/m     General appearance: alert, cooperative and appears stated age  Pelvic: External genitalia:  no lesions              Urethra:  normal appearing urethra with no masses, tenderness or lesions              Bartholins and Skenes: normal                 Vagina: normal appearing  vagina with an increase in watery, white vaginal discharge (patient denies symptoms)              Cervix: no lesions               Chaperone was present for exam.  ASSESSMENT H/o Chlamydia, s/p treatment. Other testing was negative. Reviewed risk of ectopic pregnancy and need for early monitoring in any future pregnancy.  Vaginal d/c on exam, patient denies symptoms Contraception counseling, reviewed different IUD's, she is interested in the mirena     PLAN Nuswab for GC/CT/Trich Declines any other repeat STD testing Return for mirena IUD insertion during her cycle (information given on the mirena and [place]) Recommend condoms for STD protection   An After Visit Summary was printed and given to the patient.  ~20 minutes was spent in total patient care

## 2020-01-13 NOTE — Patient Instructions (Signed)
Intrauterine Device Information An intrauterine device (IUD) is a medical device that is inserted in the uterus to prevent pregnancy. It is a small, T-shaped device that has one or two nylon strings hanging down from it. The strings hang out of the lower part of the uterus (cervix) to allow for future IUD removal. There are two types of IUDs available:  Hormone IUD. This type of IUD is made of plastic and contains the hormone progestin (synthetic progesterone). A hormone IUD may last 3-5 years.  Copper IUD. This type of IUD has copper wire wrapped around it. A copper IUD may last up to 10 years. How is an IUD inserted? An IUD is inserted through the vagina and placed into the uterus with a minor medical procedure. The exact procedure for IUD insertion may vary among health care providers and hospitals. How does an IUD work? Synthetic progesterone in a hormonal IUD prevents pregnancy by:  Thickening cervical mucus to prevent sperm from entering the uterus.  Thinning the uterine lining to prevent a fertilized egg from being implanted there. Copper in a copper IUD prevents pregnancy by making the uterus and fallopian tubes produce a fluid that kills sperm. What are the advantages of an IUD? Advantages of either type of IUD  It is highly effective in preventing pregnancy.  It is reversible. You can become pregnant shortly after the IUD is removed.  It is low-maintenance and can stay in place for a long time.  There are no estrogen-related side effects.  It can be used when breastfeeding.  It is not associated with weight gain.  It can be inserted right after childbirth, an abortion, or a miscarriage. Advantages of a hormone IUD  If it is inserted within 7 days of your period starting, it works right after it is inserted. If the hormone IUD is inserted at any other time in your cycle, you will need to use a backup method of birth control for 7 days after insertion.  It can make  menstrual periods lighter.  It can reduce menstrual cramping.  It can be used for 3-5 years. Advantages of a copper IUD  It works right after it is inserted.  It can be used as a form of emergency birth control if it is inserted within 5 days after having unprotected sex.  It does not interfere with your body's natural hormones.  It can be used for 10 years. What are the disadvantages of an IUD?  An IUD may cause irregular menstrual bleeding for a period of time after insertion.  You may have pain during insertion and have cramping and vaginal bleeding after insertion.  An IUD may cut the uterus (uterine perforation) when it is inserted. This is rare.  An IUD may cause pelvic inflammatory disease (PID), which is an infection in the uterus and fallopian tubes. This is rare, and it usually happens during the first 20 days after the IUD is inserted.  A copper IUD can make your menstrual flow heavier and more painful. How is an IUD removed?  You will lie on your back with your knees bent and your feet in footrests (stirrups).  A device will be inserted into your vagina to spread apart the vaginal walls (speculum). This will allow your health care provider to see the strings attached to the IUD.  Your health care provider will use a small instrument (forceps) to grasp the IUD strings and pull firmly until the IUD is removed. You may have some discomfort   when the IUD is removed. Your health care provider may recommend taking over-the-counter pain relievers, such as ibuprofen, before the procedure. You may also have minor spotting for a few days after the procedure. The exact procedure for IUD removal may vary among health care providers and hospitals. Is the IUD right for me? Your health care provider will make sure you are a good candidate for an IUD and will discuss the advantages, disadvantages, and possible side effects with you. Summary  An intrauterine device (IUD) is a medical  device that is inserted in the uterus to prevent pregnancy. It is a small, T-shaped device that has one or two nylon strings hanging down from it.  A hormone IUD contains the hormone progestin (synthetic progesterone). A copper IUD has copper wire wrapped around it.  Synthetic progesterone in a hormone IUD prevents pregnancy by thickening cervical mucus and thinning the walls of the uterus. Copper in a copper IUD prevents pregnancy by making the uterus and fallopian tubes produce a fluid that kills sperm.  A hormone IUD can be left in place for 3-5 years. A copper IUD can be left in place for up to 10 years.  An IUD is inserted and removed by a health care provider. You may feel some pain during insertion and removal. Your health care provider may recommend taking over-the-counter pain medicine, such as ibuprofen, before an IUD procedure. This information is not intended to replace advice given to you by your health care provider. Make sure you discuss any questions you have with your health care provider. Document Revised: 11/03/2017 Document Reviewed: 12/20/2016 Elsevier Patient Education  2020 [employer] Chlamydia, Female Chlamydia is an STD (sexually transmitted disease). It is a bacterial infection that spreads (is contagious) through sexual contact. Chlamydia can occur in different areas of the body, including:  The tube that moves urine from the bladder out of the body (urethra).  The lower part of the uterus (cervix).  The throat.  The rectum. This condition is not difficult to treat. However, if left untreated, chlamydia can lead to more serious health problems, including pelvic inflammatory disorder (PID). PID can increase your risk of not being able to have children (sterility). Also, if chlamydia is left untreated and you are pregnant or become pregnant, there is a chance that your baby can become infected during delivery. This may cause serious health problems for the  baby. What are the causes? Chlamydia is caused by the bacteria Chlamydia trachomatis. It is passed from an infected partner during sexual activity. Chlamydia can spread through contact with the genitals, mouth, or rectum. What are the signs or symptoms? In some cases, there may not be any symptoms for this condition (asymptomatic), especially early in the infection. If symptoms develop, they may include:  Burning with urination.  Frequent urination.  Vaginal discharge.  Redness, soreness, and swelling (inflammation) of the rectum.  Bleeding or discharge from the rectum.  Abdominal pain.  Pain during sexual intercourse.  Bleeding between menstrual periods.  Itching, burning, or redness in the eyes, or discharge from the eyes. How is this diagnosed? This condition may be diagnosed with:  Urine tests.  Swab tests. Depending on your symptoms, your health care provider may use a cotton swab to collect discharge from your vagina or rectum to test for the bacteria.  A pelvic exam. How is this treated? This condition is treated with antibiotic medicines. If you are pregnant, certain types of antibiotics will need to be avoided. Follow these  instructions at home: Medicines  Take over-the-counter and prescription medicines only as told by your health care provider.  Take your antibiotic medicine as told by your health care provider. Do not stop taking the antibiotic even if you start to feel better. Sexual activity  Tell sexual partners about your infection. This includes any oral, anal, or vaginal sex partners you have had within 60 days of when your symptoms started. Sexual partners should also be treated, even if they have no signs of the disease.  Do not have sex until you and your sexual partners have completed treatment and your health care provider says it is okay. If your health care provider prescribed you a single dose treatment, wait 7 days after taking the treatment  before having sex. General instructions  It is your responsibility to get your test results. Ask your health care provider, or the department performing the test, when your results will be ready.  Get plenty of rest.  Eat a healthy, well-balanced diet.  Drink enough fluids to keep your urine clear or pale yellow.  Keep all follow-up visits as told by your health care provider. This is important. You may need to be tested for infection again 3 months after treatment. How is this prevented? The only sure way to prevent chlamydia is to avoid having sex. However, you can lower your risk by:  Using latex condoms correctly every time you have sex.  Not having multiple sexual partners.  Asking if your sexual partner has been tested for STIs and had negative results. Contact a health care provider if:  You develop new symptoms or your symptoms do not get better after completing treatment.  You have a fever or chills.  You have pain during sexual intercourse. Get help right away if:  Your pain gets worse and does not get better with medicine.  You develop flu-like symptoms, such as night sweats, sore throat, or muscle aches.  You experience nausea or vomiting.  You have difficulty swallowing.  You have bleeding between periods or after sex.  You have irregular menstrual periods.  You have abdominal or lower back pain that does not get better with medicine.  You feel weak or dizzy, or you faint.  You are pregnant and you develop symptoms of chlamydia. Summary  Chlamydia is an STD (sexually transmitted disease). It is a bacterial infection that spreads (is contagious) through sexual contact.  This condition is not difficult to treat, however. If left untreated, chlamydia can lead to more serious health problems, including pelvic inflammatory disease (PID).  In some cases, there may not be any symptoms for this condition (asymptomatic).  This condition is treated with  antibiotic medicines.  Using latex condoms correctly every time you have sex can help prevent chlamydia. This information is not intended to replace advice given to you by your health care provider. Make sure you discuss any questions you have with your health care provider. Document Revised: 05/15/2018 Document Reviewed: 11/07/2016 Elsevier Patient Education  [place].

## 2020-01-15 LAB — CHLAMYDIA/GONOCOCCUS/TRICHOMONAS, NAA
Chlamydia by NAA: NEGATIVE
Gonococcus by NAA: NEGATIVE
Trich vag by NAA: NEGATIVE

## 2020-01-15 NOTE — Telephone Encounter (Signed)
Call placed to convey benefits for iud. °

## 2020-02-04 ENCOUNTER — Ambulatory Visit: Payer: BC Managed Care – PPO | Admitting: Family Medicine

## 2020-03-16 ENCOUNTER — Other Ambulatory Visit: Payer: Self-pay

## 2020-03-17 ENCOUNTER — Ambulatory Visit (INDEPENDENT_AMBULATORY_CARE_PROVIDER_SITE_OTHER): Payer: BC Managed Care – PPO

## 2020-03-17 ENCOUNTER — Ambulatory Visit: Payer: BC Managed Care – PPO | Admitting: Obstetrics and Gynecology

## 2020-03-17 ENCOUNTER — Encounter: Payer: Self-pay | Admitting: Obstetrics and Gynecology

## 2020-03-17 VITALS — BP 100/62 | HR 74 | Temp 98.8°F | Ht 60.25 in | Wt 99.4 lb

## 2020-03-17 DIAGNOSIS — R102 Pelvic and perineal pain: Secondary | ICD-10-CM | POA: Diagnosis not present

## 2020-03-17 DIAGNOSIS — N941 Unspecified dyspareunia: Secondary | ICD-10-CM

## 2020-03-17 DIAGNOSIS — K5900 Constipation, unspecified: Secondary | ICD-10-CM | POA: Diagnosis not present

## 2020-03-17 DIAGNOSIS — Z8742 Personal history of other diseases of the female genital tract: Secondary | ICD-10-CM | POA: Diagnosis not present

## 2020-03-17 DIAGNOSIS — N83201 Unspecified ovarian cyst, right side: Secondary | ICD-10-CM | POA: Diagnosis not present

## 2020-03-17 NOTE — Patient Instructions (Signed)
You can try miralax or metamucil  About Constipation  Constipation Overview Constipation is the most common gastrointestinal complaint -- about 4 million Americans experience constipation and make 2.5 million physician visits a year to get help for the problem.  Constipation can occur when the colon absorbs too much water, the colon's muscle contraction is slow or sluggish, and/or there is delayed transit time through the colon.  The result is stool that is hard and dry.  Indicators of constipation include straining during bowel movements greater than 25% of the time, having fewer than three bowel movements per week, and/or the feeling of incomplete evacuation.  There are established guidelines ([place] II ) for defining constipation. A person needs to have two or more of the following symptoms for at least 12 weeks (not necessarily consecutive) in the preceding 12 months: . Straining in  greater than 25% of bowel movements . Lumpy or hard stools in greater than 25% of bowel movements . Sensation of incomplete emptying in greater than 25% of bowel movements . Sensation of anorectal obstruction/blockade in greater than 25% of bowel movements . Manual maneuvers to help empty greater than 25% of bowel movements (e.g., digital evacuation, support of the pelvic floor)  . Less than  3 bowel movements/week . Loose stools are not present, and criteria for irritable bowel syndrome are insufficient  Common Causes of Constipation . Lack of fiber in your diet . Lack of physical activity . Medications, including iron and calcium supplements  . Dairy intake . Dehydration . Abuse of laxatives  Travel  Irritable Bowel Syndrome  Pregnancy  Luteal phase of menstruation (after ovulation and before menses)  Colorectal problems  Intestinal Dysfunction  Treating Constipation  There are several ways of treating constipation, including changes to diet and exercise, use of laxatives, adjustments to the  pelvic floor, and scheduled toileting.  These treatments include: . increasing fiber and fluids in the diet  . increasing physical activity . learning muscle coordination   learning proper toileting techniques and toileting modifications   designing and sticking  to a toileting schedule     2007, [employer] Doc.22

## 2020-03-17 NOTE — Progress Notes (Signed)
GYNECOLOGY  VISIT   HPI: 19 y.o.   Single Black or African American Not Hispanic or Latino  female   G0P0000 with Patient's last menstrual period was 02/17/2020.   here for follow up of an ovarian cyst.  She c/o continued pain prior to her cycles, starts 1-2 weeks prior to her cycle and last through her cycle. The pain is intermittent, sometimes radiates down her leg, gets some numbness. Pain in 2/21 was rare, little worse since then, not terrible.  BM qd, sometimes needs to strain, worse prior to her cycle.  Negative STD testing in 2/21.  She c/o deep dyspareunia in the last 6 months, every time she has sex. The pain is deep and on the right. Sometimes positional. Even the ultrasound today hurt.  She denies being sexually active since her last STD test (negative).  No bladder c/o.   GYNECOLOGIC HISTORY: Patient's last menstrual period was 02/17/2020. Contraception:condom  Menopausal hormone therapy: none         OB History    Gravida  0   Para  0   Term  0   Preterm  0   AB  0   Living  0     SAB  0   TAB  0   Ectopic  0   Multiple  0   Live Births  0              Patient Active Problem List   Diagnosis Date Noted  . Migraine with aura     Past Medical History:  Diagnosis Date  . Migraine with aura     Past Surgical History:  Procedure Laterality Date  . MOUTH SURGERY    . UMBILICAL HERNIA REPAIR      Current Outpatient Medications  Medication Sig Dispense Refill  . dicyclomine (BENTYL) 10 MG capsule Take 1 capsule (10 mg total) by mouth 3 (three) times daily before meals. 60 capsule 0  . famotidine (PEPCID) 20 MG tablet TAKE 1 TABLET BY MOUTH TWICE A DAY 60 tablet 0   No current facility-administered medications for this visit.     ALLERGIES: Patient has no known allergies.  No family history on file.  Social History   Socioeconomic History  . Marital status: Single    Spouse name: Not on file  . Number of children: Not on file  .  Years of education: Not on file  . Highest education level: Not on file  Occupational History  . Not on file  Tobacco Use  . Smoking status: Never Smoker  . Smokeless tobacco: Never Used  Substance and Sexual Activity  . Alcohol use: No  . Drug use: Yes    Types: Marijuana  . Sexual activity: Yes    Birth control/protection: Condom  Other Topics Concern  . Not on file  Social History Narrative  . Not on file   Social Determinants of Health   Financial Resource Strain:   . Difficulty of Paying Living Expenses:   Food Insecurity:   . Worried About Programme researcher, broadcasting/film/video in the Last Year:   . Barista in the Last Year:   Transportation Needs:   . Freight forwarder (Medical):   Marland Kitchen Lack of Transportation (Non-Medical):   Physical Activity:   . Days of Exercise per Week:   . Minutes of Exercise per Session:   Stress:   . Feeling of Stress :   Social Connections:   . Frequency of Communication  with Friends and Family:   . Frequency of Social Gatherings with Friends and Family:   . Attends Religious Services:   . Active Member of Clubs or Organizations:   . Attends Archivist Meetings:   Marland Kitchen Marital Status:   Intimate Partner Violence:   . Fear of Current or Ex-Partner:   . Emotionally Abused:   Marland Kitchen Physically Abused:   . Sexually Abused:     Review of Systems  All other systems reviewed and are negative.   PHYSICAL EXAMINATION:    BP 100/62   Pulse 74   Temp 98.8 F (37.1 C)   Ht 5' 0.25" (1.53 m)   Wt 99 lb 6.4 oz (45.1 kg)   LMP 02/17/2020   SpO2 98%   BMI 19.25 kg/m     General appearance: alert, cooperative and appears stated age Abdomen: soft, mildly tender in the right lower quadrant in the region of the ascending colon. ; non distended, no masses,  no organomegaly  Pelvic: External genitalia:  no lesions              Urethra:  normal appearing urethra with no masses, tenderness or lesions              Bartholins and Skenes: normal                  Vagina: normal appearing vagina with normal color and discharge, no lesions              Cervix: no cervical motion tenderness and no lesions              Bimanual Exam:  Uterus:  normal size, contour, position, consistency, mobility, non-tender              Adnexa: no masses, tender in the right adnexa, but more tender with abdominal palpation.              Pelvic floor: not tender.  Chaperone was present for exam.  ASSESSMENT Ovarian cyst, resolved. No further f/u needed Pelvic pain, slightly worse, no gyn source noted. No pelvic floor tenderness, no uterine or cervical tenderness. Adnexal tenderness is worse with the abdominal hand, suspect GI source Mild constipation, she is tender in the area of her ascending colon.  Deep dyspareunia.     PLAN Discussed constipation, information given. She can use miralax or metamucil Discussed calendaring her pain, her BM, watching her diet. Discussed that dairy and gluten can cause GI upset. She did have a negative Celiac Panel.  Consider f/u with GI or her primary.    An After Visit Summary was printed and given to the patient.  In addition to reviewing the ultrasound (normal, no f/u needed), over 15 minutes was spent in patient care evaluating her c/o worsening pain, and new c/o constipation and discussing management.

## 2020-06-22 IMAGING — DX DG CHEST 2V
2 series · 2 of 2 positions shown · non-contrast
Comparison: None.

CLINICAL DATA: Unintentional weight loss

EXAM:
CHEST - 2 VIEW

[chest pa]
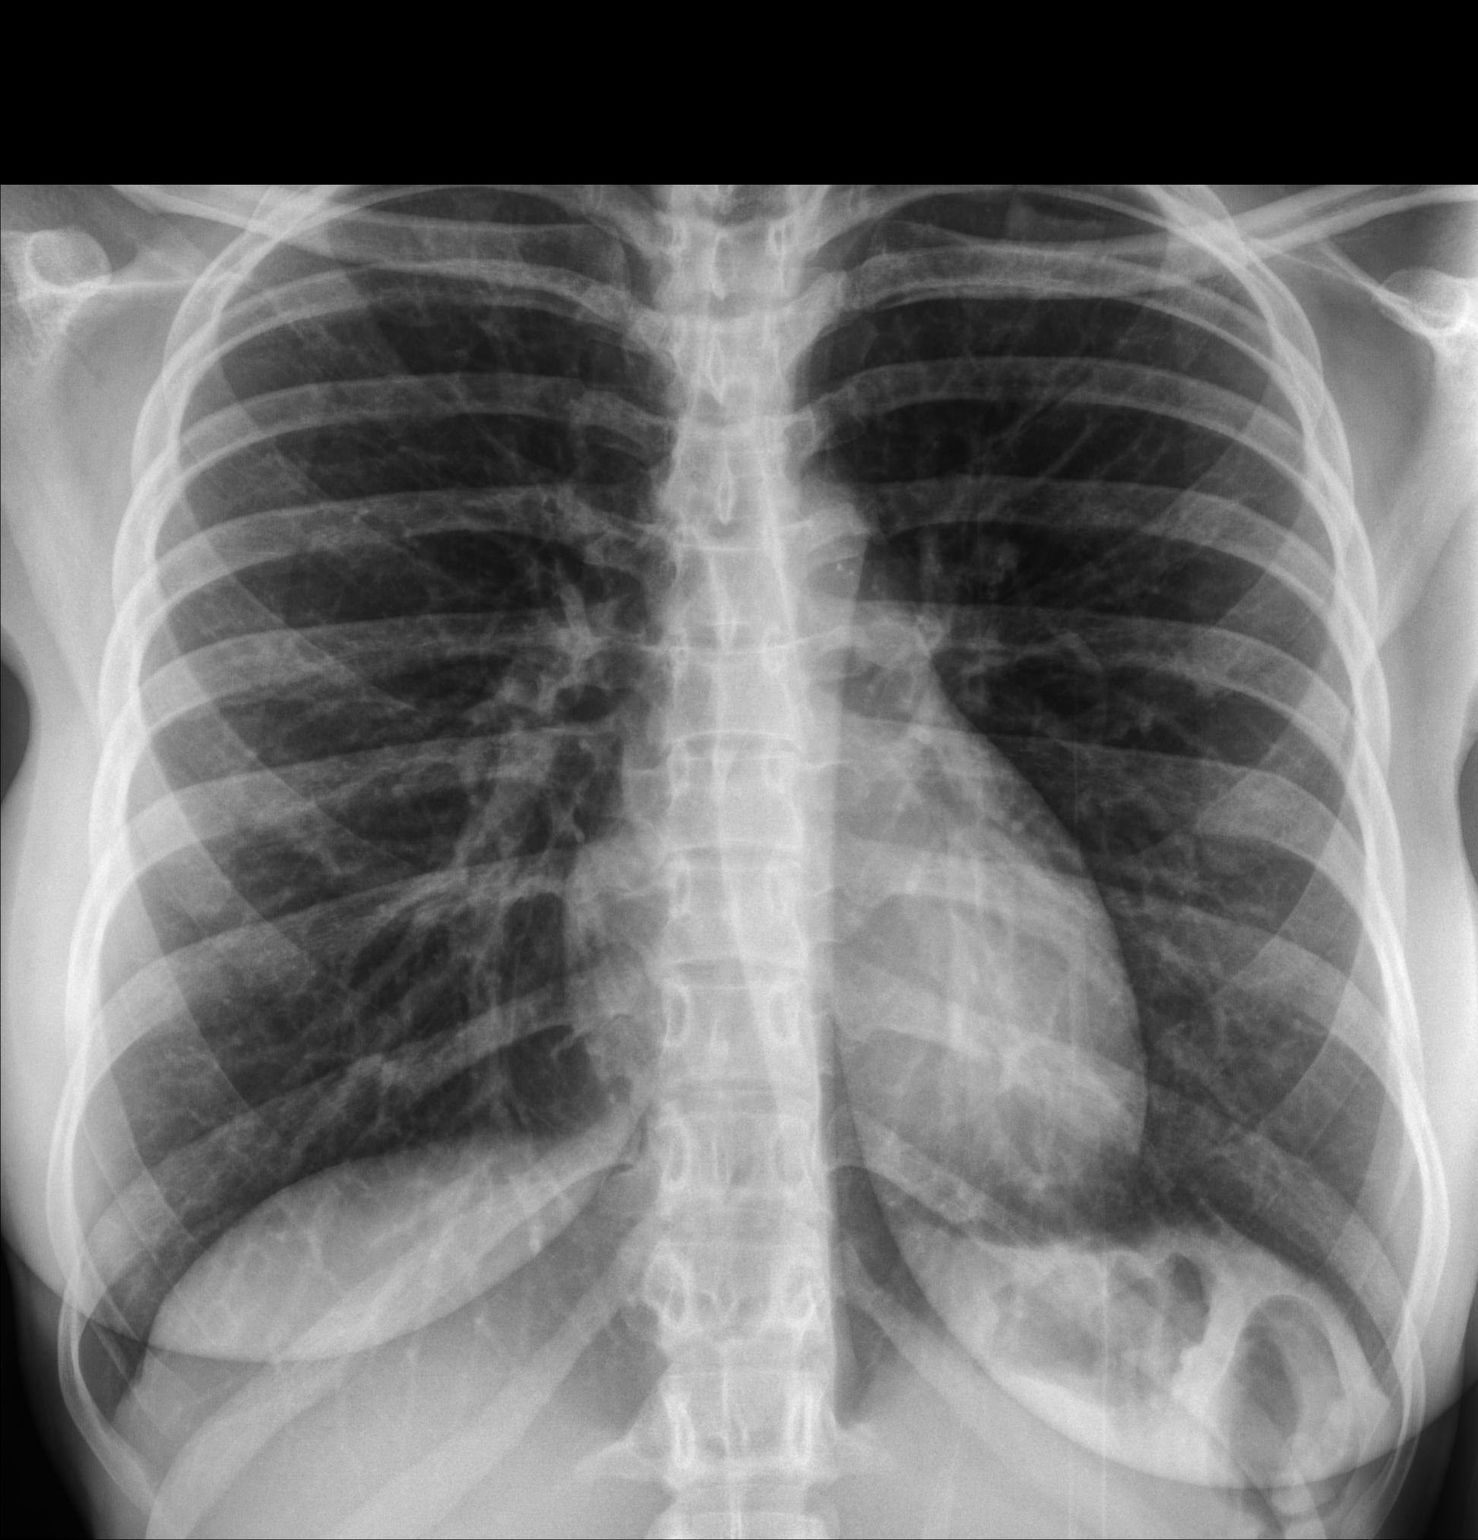

[chest lat]
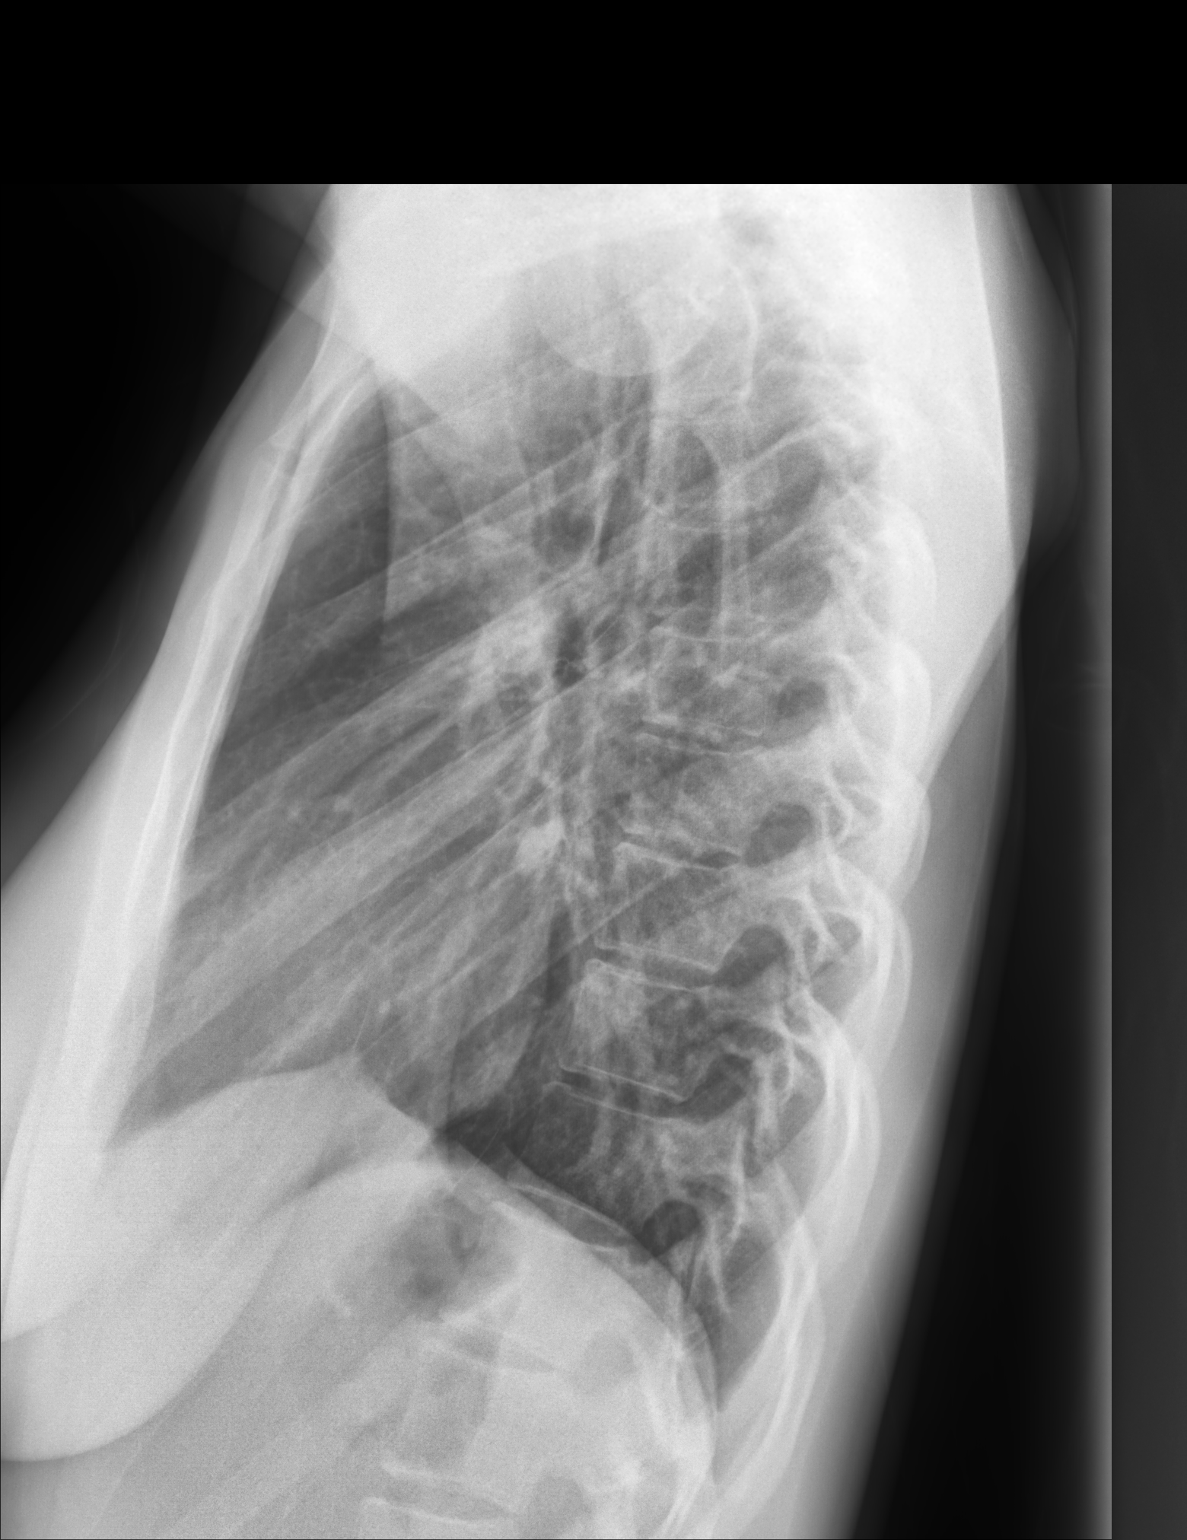

[2 of 2 positions shown; findings below may reference images not displayed]

FINDINGS: The heart size and mediastinal contours are within normal limits.
Both lungs are clear. The visualized skeletal structures are
unremarkable.
IMPRESSION: No active cardiopulmonary disease.

## 2020-07-16 ENCOUNTER — Other Ambulatory Visit: Payer: Self-pay

## 2020-07-16 ENCOUNTER — Emergency Department (HOSPITAL_COMMUNITY)
Admission: EM | Admit: 2020-07-16 | Discharge: 2020-07-17 | Disposition: A | Discharge status: Home / Self Care | Payer: BC Managed Care – PPO | Attending: Emergency Medicine | Admitting: Emergency Medicine

## 2020-07-16 DIAGNOSIS — Z5321 Procedure and treatment not carried out due to patient leaving prior to being seen by health care provider: Secondary | ICD-10-CM | POA: Diagnosis not present

## 2020-07-16 DIAGNOSIS — S50812A Abrasion of left forearm, initial encounter: Secondary | ICD-10-CM | POA: Insufficient documentation

## 2020-07-16 DIAGNOSIS — Y998 Other external cause status: Secondary | ICD-10-CM | POA: Insufficient documentation

## 2020-07-16 DIAGNOSIS — S80812A Abrasion, left lower leg, initial encounter: Secondary | ICD-10-CM | POA: Diagnosis not present

## 2020-07-16 DIAGNOSIS — Y9289 Other specified places as the place of occurrence of the external cause: Secondary | ICD-10-CM | POA: Diagnosis not present

## 2020-07-16 DIAGNOSIS — S90512A Abrasion, left ankle, initial encounter: Secondary | ICD-10-CM | POA: Insufficient documentation

## 2020-07-16 DIAGNOSIS — Y9389 Activity, other specified: Secondary | ICD-10-CM | POA: Insufficient documentation

## 2020-07-16 DIAGNOSIS — X810XXA Intentional self-harm by jumping or lying in front of motor vehicle, initial encounter: Secondary | ICD-10-CM | POA: Insufficient documentation
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
## 2020-07-17 ENCOUNTER — Emergency Department (HOSPITAL_COMMUNITY): Payer: BC Managed Care – PPO

## 2020-07-17 NOTE — ED Notes (Signed)
Pt mother came to pick her up. Pt she will call medical records for results.Pt said the wait  was to long. Pt told the risk of leaving without being seen.

## 2021-08-16 ENCOUNTER — Other Ambulatory Visit: Payer: Self-pay

## 2021-08-16 ENCOUNTER — Ambulatory Visit
Admission: EM | Admit: 2021-08-16 | Discharge: 2021-08-16 | Disposition: A | Discharge status: Home / Self Care | Payer: BC Managed Care – PPO

## 2021-08-17 ENCOUNTER — Emergency Department (HOSPITAL_COMMUNITY)
Admission: EM | Admit: 2021-08-17 | Discharge: 2021-08-17 | Discharge status: Against Medical Advice | Payer: BC Managed Care – PPO | Attending: Emergency Medicine | Admitting: Emergency Medicine

## 2021-08-17 ENCOUNTER — Other Ambulatory Visit: Payer: Self-pay

## 2021-08-17 ENCOUNTER — Emergency Department (HOSPITAL_BASED_OUTPATIENT_CLINIC_OR_DEPARTMENT_OTHER)
Admission: EM | Admit: 2021-08-17 | Discharge: 2021-08-17 | Disposition: A | Discharge status: Home / Self Care | Payer: Self-pay | Source: Home / Self Care

## 2021-08-17 ENCOUNTER — Encounter (HOSPITAL_BASED_OUTPATIENT_CLINIC_OR_DEPARTMENT_OTHER): Payer: Self-pay

## 2021-08-17 ENCOUNTER — Encounter (HOSPITAL_COMMUNITY): Payer: Self-pay | Admitting: Emergency Medicine

## 2021-08-17 DIAGNOSIS — R112 Nausea with vomiting, unspecified: Secondary | ICD-10-CM | POA: Insufficient documentation

## 2021-08-17 DIAGNOSIS — R1110 Vomiting, unspecified: Secondary | ICD-10-CM | POA: Diagnosis not present

## 2021-08-17 DIAGNOSIS — Z5321 Procedure and treatment not carried out due to patient leaving prior to being seen by health care provider: Secondary | ICD-10-CM | POA: Insufficient documentation

## 2021-08-17 DIAGNOSIS — R197 Diarrhea, unspecified: Secondary | ICD-10-CM | POA: Insufficient documentation

## 2021-08-17 DIAGNOSIS — R1030 Lower abdominal pain, unspecified: Secondary | ICD-10-CM | POA: Insufficient documentation

## 2021-08-17 LAB — COMPREHENSIVE METABOLIC PANEL
ALT: 20 U/L (ref 0–44)
ALT: 16 U/L (ref 0–44)
AST: 27 U/L (ref 15–41)
AST: 24 U/L (ref 15–41)
Albumin: 4.7 g/dL (ref 3.5–5.0)
Albumin: 5.1 g/dL — ABNORMAL HIGH (ref 3.5–5.0)
Alkaline Phosphatase: 41 U/L (ref 38–126)
Alkaline Phosphatase: 31 U/L — ABNORMAL LOW (ref 38–126)
Anion gap: 14 (ref 5–15)
Anion gap: 13 (ref 5–15)
BUN: 18 mg/dL (ref 6–20)
BUN: 19 mg/dL (ref 6–20)
CO2: 22 mmol/L (ref 22–32)
CO2: 22 mmol/L (ref 22–32)
Calcium: 10 mg/dL (ref 8.9–10.3)
Calcium: 10.3 mg/dL (ref 8.9–10.3)
Chloride: 101 mmol/L (ref 98–111)
Chloride: 101 mmol/L (ref 98–111)
Creatinine, Ser: 1.06 mg/dL — ABNORMAL HIGH (ref 0.44–1.00)
Creatinine, Ser: 0.87 mg/dL (ref 0.44–1.00)
GFR, Estimated: >60 mL/min (ref >60)
GFR, Estimated: >60 mL/min (ref >60)
Glucose, Bld: 196 mg/dL — ABNORMAL HIGH (ref 70–99)
Glucose, Bld: 119 mg/dL — ABNORMAL HIGH (ref 70–99)
Potassium: 3.3 mmol/L — ABNORMAL LOW (ref 3.5–5.1)
Potassium: 3.9 mmol/L (ref 3.5–5.1)
Sodium: 137 mmol/L (ref 135–145)
Sodium: 136 mmol/L (ref 135–145)
Total Bilirubin: 0.5 mg/dL (ref 0.3–1.2)
Total Bilirubin: 0.4 mg/dL (ref 0.3–1.2)
Total Protein: 8.6 g/dL — ABNORMAL HIGH (ref 6.5–8.1)
Total Protein: 8.9 g/dL — ABNORMAL HIGH (ref 6.5–8.1)

## 2021-08-17 LAB — CBC
HCT: 40.9 % (ref 36.0–46.0)
HCT: 38.2 % (ref 36.0–46.0)
Hemoglobin: 14.1 g/dL (ref 12.0–15.0)
Hemoglobin: 13.4 g/dL (ref 12.0–15.0)
MCH: 30.4 pg (ref 26.0–34.0)
MCH: 30.1 pg (ref 26.0–34.0)
MCHC: 34.5 g/dL (ref 30.0–36.0)
MCHC: 35.1 g/dL (ref 30.0–36.0)
MCV: 88.1 fL (ref 80.0–100.0)
MCV: 85.8 fL (ref 80.0–100.0)
Platelets: 324 10*3/uL (ref 150–400)
Platelets: 331 10*3/uL (ref 150–400)
RBC: 4.64 MIL/uL (ref 3.87–5.11)
RBC: 4.45 MIL/uL (ref 3.87–5.11)
RDW: 13.2 % (ref 11.5–15.5)
RDW: 13 % (ref 11.5–15.5)
WBC: 13.2 10*3/uL — ABNORMAL HIGH (ref 4.0–10.5)
WBC: 12.6 10*3/uL — ABNORMAL HIGH (ref 4.0–10.5)
nRBC: 0 % (ref 0.0–0.2)
nRBC: 0 % (ref 0.0–0.2)

## 2021-08-17 LAB — LIPASE, BLOOD
Lipase: 21 U/L (ref 11–51)
Lipase: <10 U/L — ABNORMAL LOW (ref 11–51)

## 2021-08-17 LAB — I-STAT BETA HCG BLOOD, ED (MC, WL, AP ONLY): I-stat hCG, quantitative: <5 m[IU]/mL (ref <5)

## 2021-08-17 MED ORDER — ONDANSETRON 4 MG PO TBDP
4.0000 mg | ORAL_TABLET | Freq: Once | ORAL | Status: AC | PRN
  Administered 2021-08-17: 4 mg via ORAL
  Filled 2021-08-17: qty 1

## 2021-08-17 MED ORDER — ONDANSETRON HCL 4 MG/2ML IJ SOLN
4.0000 mg | Freq: Once | INTRAMUSCULAR | Status: AC | PRN
  Administered 2021-08-17: 4 mg via INTRAVENOUS

## 2021-08-17 NOTE — ED Notes (Signed)
Patient informed Staff she would like to leave Department. PIV removed by Charletta Cousin., RN. Patient to be discharged appropriately.

## 2021-08-17 NOTE — ED Notes (Signed)
Pt left without letting staff know she was leaving. Called for pt 3 times. No answer

## 2021-08-17 NOTE — ED Notes (Signed)
Patient insisting that PIV be taken out as she is going home  Encouraged to stay.  States will leave and follow up with a doctor tomorrow.

## 2021-08-17 NOTE — ED Triage Notes (Signed)
Patient BIB [facility] from Home with ABD Pain and N/V/D for approximately 3 days PTA.  Patient states her Pain is in the Lower ABD and has been worsening since it began along with the N/V/D.   BIB Stretcher/Wheelchair. Patient in obvious Discomfort. A&Ox4. 18G IV secured in LAC by [facility].

## 2021-08-17 NOTE — ED Triage Notes (Signed)
Pt states she has been having abd pain, diarrhea, vomiting since yesterday.

## 2021-08-18 ENCOUNTER — Encounter (HOSPITAL_COMMUNITY): Payer: Self-pay

## 2021-08-18 ENCOUNTER — Emergency Department (HOSPITAL_COMMUNITY): Payer: BC Managed Care – PPO

## 2021-08-18 ENCOUNTER — Emergency Department (HOSPITAL_COMMUNITY)
Admission: EM | Admit: 2021-08-18 | Discharge: 2021-08-18 | Disposition: A | Discharge status: Home / Self Care | Payer: BC Managed Care – PPO | Attending: Emergency Medicine | Admitting: Emergency Medicine

## 2021-08-18 DIAGNOSIS — R1084 Generalized abdominal pain: Secondary | ICD-10-CM | POA: Insufficient documentation

## 2021-08-18 DIAGNOSIS — Z20822 Contact with and (suspected) exposure to covid-19: Secondary | ICD-10-CM | POA: Diagnosis not present

## 2021-08-18 LAB — CBC WITH DIFFERENTIAL/PLATELET
Abs Immature Granulocytes: 0.03 10*3/uL (ref 0.00–0.07)
Basophils Absolute: 0 10*3/uL (ref 0.0–0.1)
Basophils Relative: 0 %
Eosinophils Absolute: 0 10*3/uL (ref 0.0–0.5)
Eosinophils Relative: 0 %
HCT: 41.9 % (ref 36.0–46.0)
Hemoglobin: 14.4 g/dL (ref 12.0–15.0)
Immature Granulocytes: 0 %
Lymphocytes Relative: 12 %
Lymphs Abs: 1.5 10*3/uL (ref 0.7–4.0)
MCH: 30.1 pg (ref 26.0–34.0)
MCHC: 34.4 g/dL (ref 30.0–36.0)
MCV: 87.5 fL (ref 80.0–100.0)
Monocytes Absolute: 1 10*3/uL (ref 0.1–1.0)
Monocytes Relative: 9 %
Neutro Abs: 9.3 10*3/uL — ABNORMAL HIGH (ref 1.7–7.7)
Neutrophils Relative %: 79 %
Platelets: 310 10*3/uL (ref 150–400)
RBC: 4.79 MIL/uL (ref 3.87–5.11)
RDW: 13.1 % (ref 11.5–15.5)
WBC: 11.8 10*3/uL — ABNORMAL HIGH (ref 4.0–10.5)
nRBC: 0 % (ref 0.0–0.2)

## 2021-08-18 LAB — URINALYSIS, MICROSCOPIC (REFLEX)

## 2021-08-18 LAB — COMPREHENSIVE METABOLIC PANEL
ALT: 24 U/L (ref 0–44)
AST: 29 U/L (ref 15–41)
Albumin: 5.3 g/dL — ABNORMAL HIGH (ref 3.5–5.0)
Alkaline Phosphatase: 39 U/L (ref 38–126)
Anion gap: 15 (ref 5–15)
BUN: 14 mg/dL (ref 6–20)
CO2: 24 mmol/L (ref 22–32)
Calcium: 10.4 mg/dL — ABNORMAL HIGH (ref 8.9–10.3)
Chloride: 98 mmol/L (ref 98–111)
Creatinine, Ser: 0.75 mg/dL (ref 0.44–1.00)
GFR, Estimated: >60 mL/min (ref >60)
Glucose, Bld: 122 mg/dL — ABNORMAL HIGH (ref 70–99)
Potassium: 3.4 mmol/L — ABNORMAL LOW (ref 3.5–5.1)
Sodium: 137 mmol/L (ref 135–145)
Total Bilirubin: 0.7 mg/dL (ref 0.3–1.2)
Total Protein: 9.4 g/dL — ABNORMAL HIGH (ref 6.5–8.1)

## 2021-08-18 LAB — URINALYSIS, ROUTINE W REFLEX MICROSCOPIC
Bilirubin Urine: NEGATIVE
Glucose, UA: NEGATIVE mg/dL
Hgb urine dipstick: NEGATIVE
Ketones, ur: NEGATIVE mg/dL
Leukocytes,Ua: NEGATIVE
Nitrite: NEGATIVE
Protein, ur: 30 mg/dL — AB
Specific Gravity, Urine: 1.02 (ref 1.005–1.030)
pH: 6 (ref 5.0–8.0)

## 2021-08-18 LAB — RESP PANEL BY RT-PCR (FLU A&B, COVID) ARPGX2
Influenza A by PCR: NEGATIVE
Influenza B by PCR: NEGATIVE
SARS Coronavirus 2 by RT PCR: NEGATIVE

## 2021-08-18 LAB — LIPASE, BLOOD: Lipase: 20 U/L (ref 11–51)

## 2021-08-18 MED ORDER — IOHEXOL 350 MG/ML SOLN
100.0000 mL | Freq: Once | INTRAVENOUS | Status: AC | PRN
  Administered 2021-08-18: 60 mL via INTRAVENOUS

## 2021-08-18 MED ORDER — ONDANSETRON HCL 4 MG/2ML IJ SOLN
4.0000 mg | Freq: Once | INTRAMUSCULAR | Status: AC
  Administered 2021-08-18: 4 mg via INTRAVENOUS
  Filled 2021-08-18: qty 2

## 2021-08-18 MED ORDER — ONDANSETRON 4 MG PO TBDP: 4.0000 mg | ORAL_TABLET | Freq: Three times a day (TID) | ORAL | 1 refills | Status: DC | PRN

## 2021-08-18 MED ORDER — IOHEXOL 9 MG/ML PO SOLN: 1000.0000 mL | ORAL | Status: AC

## 2021-08-18 MED ORDER — HYDROCODONE-ACETAMINOPHEN 5-325 MG PO TABS: 1.0000 | ORAL_TABLET | Freq: Four times a day (QID) | ORAL | 0 refills | Status: DC | PRN

## 2021-08-18 MED ORDER — KETOROLAC TROMETHAMINE 15 MG/ML IJ SOLN
15.0000 mg | Freq: Once | INTRAMUSCULAR | Status: AC
  Administered 2021-08-18: 15 mg via INTRAVENOUS
  Filled 2021-08-18: qty 1

## 2021-08-18 MED ORDER — HYDROMORPHONE HCL 1 MG/ML IJ SOLN
1.0000 mg | Freq: Once | INTRAMUSCULAR | Status: AC
  Administered 2021-08-18: 1 mg via INTRAVENOUS
  Filled 2021-08-18: qty 1

## 2021-08-18 MED ORDER — SODIUM CHLORIDE 0.9 % IV BOLUS
1000.0000 mL | Freq: Once | INTRAVENOUS | Status: AC
  Administered 2021-08-18: 1000 mL via INTRAVENOUS

## 2021-08-18 MED ORDER — IOHEXOL 9 MG/ML PO SOLN
ORAL | Status: AC
  Filled 2021-08-18: qty 1000

## 2021-08-18 NOTE — ED Triage Notes (Signed)
Pt arrived via POV, c/o diffuse abd pain and vomiting x3 days. Denies any other sx.

## 2021-08-18 NOTE — ED Notes (Signed)
Patient transported to CT 

## 2021-08-18 NOTE — ED Provider Notes (Addendum)
Emergency Medicine Provider Triage Evaluation Note  Christina Mays , a 20 y.o. female  was evaluated in triage.  Pt complains of abdominal pain.  Patient with abdominal pain for 3 days.  She has had nausea and vomiting.  She has not been able to keep any food down.  She has been seen at [facility], ED and droppage over the past 2 days however both times she was triaged and did not wait to be seen.  She denies any blood in her vomit.  She denies any diarrhea.  She says she has not able to have a bowel movement.  Denies any chest pain.  She does have some shortness of breath but I think that is associated to her crying and anxiety.  She says antiemetics have not helped her.  Has not had any associated fevers.  She does have congestion and sore throat that started yesterday though.    Plan to run COVID test.  CT scan to rule out bowel obstruction and symptoms have not improved for several days.  Routine labs also ordered.  Test was negative yesterday so will not be run out.  Review of Systems  Positive: Abdominal pain, nausea, vomiting, shortness of breath, constipation, congestion, sore throat Negative: Chest pain, diarrhea, fever, chills  Physical Exam  BP 132/85 (BP Location: Right Arm)   Pulse 97   Temp 98.9 F (37.2 C) (Oral)   Resp 18   LMP 08/05/2021   SpO2 99%  Gen:   Awake, appears to be in acute distress.  Patient is crying and in fetal position. Resp:  Normal effort.  Lung sounds clear to auscultation MSK:   Moves extremities without difficulty  Other:  Abdomen is soft and not peritonitic on exam.  Generalized tenderness on entire abdomen on exam.  Heart sounds normal.  Medical Decision Making  Medically screening exam initiated at 12:24 PM.  Appropriate orders placed.  [name] was informed that the remainder of the evaluation will be completed by another provider, this initial triage assessment does not replace that evaluation, and the importance of remaining in the ED until their  evaluation is complete.   Claudie Leach, PA-C 08/18/21 1228    Claudie Leach, PA-C 08/18/21 1255    Cathren Laine, MD 08/18/21 1520

## 2021-08-18 NOTE — ED Provider Notes (Signed)
Patient's work-up for the abdominal pain nausea and vomiting without any acute findings.  Mild leukocytosis with a white blood cell count 11,000.  Otherwise labs were normal.  Including electrolytes and liver function test.  Patient improved here some with Toradol and Zofran.  Patient was awaiting COVID and influenza testing which was negative.  And awaiting urinalysis which was negative for urinary tract infection.  Patient stable for discharge home and symptomatic treatment.  We will treat with short course of pain medication and Zofran.  Patient not really having any significant diarrhea.   Vanetta Mulders, MD 08/18/21 [phone]

## 2021-08-18 NOTE — ED Provider Notes (Signed)
[facility]-EMERGENCY DEPT Provider Note   CSN: [id number] Arrival date & time: 08/18/21  1127     History Chief Complaint  Patient presents with   Abdominal Pain    Christina Mays is a 20 y.o. female.  20 year old female with prior medical history as detailed below presents for evaluation.  Patient complains of 3 to 4 days of diffuse abdominal cramping.  This is associate with nausea and vomiting.  She reports that she has had significant difficulty keeping any fluids down.  She apparently was triaged at [facility] ED yesterday -she left prior to full evaluation by provider secondary to wait time.  She denies associated fever.  She denies diarrhea.  The history is provided by the patient.  Abdominal Pain Pain location:  Generalized Pain quality: cramping   Pain radiates to:  Does not radiate Pain severity:  Moderate Onset quality:  Gradual Duration:  3 days Timing:  Constant Progression:  Unchanged Chronicity:  New     Past Medical History:  Diagnosis Date   Migraine with aura     Patient Active Problem List   Diagnosis Date Noted   Migraine with aura     Past Surgical History:  Procedure Laterality Date   MOUTH SURGERY     UMBILICAL HERNIA REPAIR       OB History     Gravida  0   Para  0   Term  0   Preterm  0   AB  0   Living  0      SAB  0   IAB  0   Ectopic  0   Multiple  0   Live Births  0           History reviewed. No pertinent family history.  Social History   Tobacco Use   Smoking status: Never   Smokeless tobacco: Never  Vaping Use   Vaping Use: Never used  Substance Use Topics   Alcohol use: No   Drug use: Yes    Types: Marijuana    Home Medications Prior to Admission medications   Medication Sig Start Date End Date Taking? Authorizing Provider  dicyclomine (BENTYL) 10 MG capsule Take 1 capsule (10 mg total) by mouth 3 (three) times daily before meals. 10/23/19   Cirigliano, Jearld Lesch, DO   famotidine (PEPCID) 20 MG tablet TAKE 1 TABLET BY MOUTH TWICE A DAY 12/09/19   Cirigliano, Jearld Lesch, DO    Allergies    Pepto-bismol [bismuth subsalicylate]  Review of Systems   Review of Systems  Gastrointestinal:  Positive for abdominal pain.  All other systems reviewed and are negative.  Physical Exam Updated Vital Signs BP 135/85   Pulse 80   Temp 99.1 F (37.3 C) (Oral)   Resp 16   LMP 08/05/2021   SpO2 100%   Physical Exam Vitals and nursing note reviewed.  Constitutional:      General: She is not in acute distress.    Appearance: Normal appearance. She is well-developed.  HENT:     Head: Normocephalic and atraumatic.  Eyes:     Conjunctiva/sclera: Conjunctivae normal.     Pupils: Pupils are equal, round, and reactive to light.  Cardiovascular:     Rate and Rhythm: Normal rate and regular rhythm.     Heart sounds: Normal heart sounds.  Pulmonary:     Effort: Pulmonary effort is normal. No respiratory distress.     Breath sounds: Normal breath sounds.  Abdominal:  General: There is no distension.     Palpations: Abdomen is soft.     Tenderness: There is generalized abdominal tenderness. There is no guarding or rebound.  Musculoskeletal:        General: No deformity. Normal range of motion.     Cervical back: Normal range of motion and neck supple.  Skin:    General: Skin is warm and dry.  Neurological:     General: No focal deficit present.     Mental Status: She is alert and oriented to person, place, and time.    ED Results / Procedures / Treatments   Labs (all labs ordered are listed, but only abnormal results are displayed) Labs Reviewed  CBC WITH DIFFERENTIAL/PLATELET - Abnormal; Notable for the following components:      Result Value   WBC 11.8 (*)    Neutro Abs 9.3 (*)    All other components within normal limits  COMPREHENSIVE METABOLIC PANEL - Abnormal; Notable for the following components:   Potassium 3.4 (*)    Glucose, Bld 122 (*)     Calcium 10.4 (*)    Total Protein 9.4 (*)    Albumin 5.3 (*)    All other components within normal limits  URINALYSIS, ROUTINE W REFLEX MICROSCOPIC - Abnormal; Notable for the following components:   Protein, ur 30 (*)    All other components within normal limits  URINALYSIS, MICROSCOPIC (REFLEX) - Abnormal; Notable for the following components:   Bacteria, UA RARE (*)    All other components within normal limits  RESP PANEL BY RT-PCR (FLU A&B, COVID) ARPGX2  LIPASE, BLOOD    EKG None  Radiology CT Abdomen Pelvis W Contrast  Result Date: 08/18/2021 CLINICAL DATA:  Nausea and vomiting.  Epigastric pain. EXAM: CT ABDOMEN AND PELVIS WITH CONTRAST TECHNIQUE: Multidetector CT imaging of the abdomen and pelvis was performed using the standard protocol following bolus administration of intravenous contrast. CONTRAST:  72mL OMNIPAQUE IOHEXOL 350 MG/ML SOLN COMPARISON:  CT abdomen and pelvis 09/05/2019. FINDINGS: Lower chest: There is a 5 mm left lower lobe nodule image 4/4. This area of the lungs was not included on prior imaging. Lung bases are otherwise clear. Hepatobiliary: Again noted is focal fat along the falciform ligament. There is a rounded hypodensity in the right lobe of the liver which is too small to characterize, possibly a small cyst or hemangioma. Gallbladder and bile ducts appear within normal limits. Pancreas: Unremarkable. No pancreatic ductal dilatation or surrounding inflammatory changes. Spleen: Normal in size without focal abnormality. Adrenals/Urinary Tract: Adrenal glands are unremarkable. Kidneys are normal, without renal calculi, focal lesion, or hydronephrosis. Bladder is unremarkable. Stomach/Bowel: Stomach is within normal limits. Appendix appears normal. No evidence of bowel wall thickening, distention, or inflammatory changes. Vascular/Lymphatic: No significant vascular findings are present. No enlarged abdominal or pelvic lymph nodes. Reproductive: Uterus and bilateral  adnexa are unremarkable. Other: No abdominal wall hernia or abnormality. No abdominopelvic ascites. Musculoskeletal: No acute or significant osseous findings. IMPRESSION: 1. No acute localizing process in the abdomen or pelvis. 2. Indeterminate 5 mm left lower lobe nodule. No follow-up needed if patient is low-risk. Non-contrast chest CT can be considered in 12 months if patient is high-risk. This recommendation follows the consensus statement: Guidelines for Management of Incidental Pulmonary Nodules Detected on CT Images: From the Fleischner Society 2017; Radiology 2017; 284:228-243. Electronically Signed   By: Darliss Cheney M.D.   On: 08/18/2021 15:20    Procedures Procedures   Medications Ordered in  ED Medications  iohexol (OMNIPAQUE) 9 MG/ML oral solution 1,000 mL (0 mLs Oral Hold 08/18/21 1418)  iohexol (OMNIPAQUE) 9 MG/ML oral solution (0 mLs  Hold 08/18/21 1418)  iohexol (OMNIPAQUE) 350 MG/ML injection 100 mL (has no administration in time range)  sodium chloride 0.9 % bolus 1,000 mL (1,000 mLs Intravenous New Bag/Given 08/18/21 1418)  ondansetron (ZOFRAN) injection 4 mg (4 mg Intravenous Given 08/18/21 1417)  ketorolac (TORADOL) 15 MG/ML injection 15 mg (15 mg Intravenous Given 08/18/21 1417)    ED Course  I have reviewed the triage vital signs and the nursing notes.  Pertinent labs & imaging results that were available during my care of the patient were reviewed by me and considered in my medical decision making (see chart for details).    MDM Rules/Calculators/A&P                           MDM  MSE complete  Christina Mays was evaluated in Emergency Department on 08/18/2021 for the symptoms described in the history of present illness. She was evaluated in the context of the global COVID-19 pandemic, which necessitated consideration that the patient might be at risk for infection with the SARS-CoV-2 virus that causes COVID-19. Institutional protocols and algorithms that pertain to the  evaluation of patients at risk for COVID-19 are in a state of rapid change based on information released by regulatory bodies including the CDC and federal and state organizations. These policies and algorithms were followed during the patient's care in the ED.  Patient is presenting with complaint of generalized abdominal cramping pain with associated nausea and vomiting.  Screening labs to be obtained.  CT imaging is pending.  Will administer IVF and anti-emetics/pain medications.  Pending studies and disposition signed over to oncoming EDP.   Final Clinical Impression(s) / ED Diagnoses Final diagnoses:  Generalized abdominal pain    Rx / DC Orders ED Discharge Orders          Ordered    HYDROcodone-acetaminophen (NORCO/VICODIN) 5-325 MG tablet  Every 6 hours PRN        08/18/21 1643    ondansetron (ZOFRAN-ODT) 4 MG disintegrating tablet  Every 8 hours PRN        08/18/21 1643             Wynetta Fines, MD 08/20/21 [phone]

## 2021-08-18 NOTE — Discharge Instructions (Signed)
Take the antinausea medicine as directed.  Take the pain medicine as needed.  Take small sips of fluids frequently with sugar.  When doing better advance to bland diet.  Follow-up with wellness clinic provided above.  And also follow-up with your OB/GYN if things do not improve over the next few days.  Return for any new or worse symptoms.  Today's labs and CT scan of the abdomen without any significant findings which is reassuring.

## 2021-08-20 ENCOUNTER — Encounter: Payer: Self-pay | Admitting: Obstetrics and Gynecology

## 2021-08-20 ENCOUNTER — Other Ambulatory Visit: Payer: Self-pay

## 2021-08-20 ENCOUNTER — Ambulatory Visit: Payer: BC Managed Care – PPO | Admitting: Obstetrics and Gynecology

## 2021-08-20 VITALS — BP 100/78 | HR 100 | Ht 60.0 in | Wt 100.0 lb

## 2021-08-20 DIAGNOSIS — R1084 Generalized abdominal pain: Secondary | ICD-10-CM | POA: Diagnosis not present

## 2021-08-20 DIAGNOSIS — R112 Nausea with vomiting, unspecified: Secondary | ICD-10-CM

## 2021-08-20 LAB — PREGNANCY, URINE: Preg Test, Ur: NEGATIVE

## 2021-08-20 MED ORDER — PROMETHAZINE HCL 12.5 MG RE SUPP: 12.5000 mg | Freq: Four times a day (QID) | RECTAL | 1 refills | Status: DC | PRN

## 2021-08-20 NOTE — Progress Notes (Signed)
GYNECOLOGY  VISIT   HPI: 20 y.o.   Single Black or African American Not Hispanic or Latino  female   G0P0000 with Patient's last menstrual period was 08/05/2021 (approximate).   here for right lower abdominal pain that started on Sunday. Patient states that the pain has gotten worse since then    She states she has not been able to eat in 5 days. She states that she has been throwing up. On Sunday she had diarrhea. No BM since Monday. Nausea started on Monday, vomiting repeatedly throughout the day.   She was seen in the ER yesterday. Negative CT other then lung nodule (report reviewed). Only finding was a minimally elevated WBC count.  Patient was advised to f/u with primary care for lung nodule.   She is sexually active with long term female partner.    GYNECOLOGIC HISTORY: Patient's last menstrual period was 08/05/2021 (approximate). Contraception:none  Menopausal hormone therapy: none         OB History     Gravida  0   Para  0   Term  0   Preterm  0   AB  0   Living  0      SAB  0   IAB  0   Ectopic  0   Multiple  0   Live Births  0              Patient Active Problem List   Diagnosis Date Noted   Migraine with aura     Past Medical History:  Diagnosis Date   Migraine with aura     Past Surgical History:  Procedure Laterality Date   MOUTH SURGERY     UMBILICAL HERNIA REPAIR      Current Outpatient Medications  Medication Sig Dispense Refill   dicyclomine (BENTYL) 10 MG capsule Take 1 capsule (10 mg total) by mouth 3 (three) times daily before meals. 60 capsule 0   famotidine (PEPCID) 20 MG tablet TAKE 1 TABLET BY MOUTH TWICE A DAY 60 tablet 0   HYDROcodone-acetaminophen (NORCO/VICODIN) 5-325 MG tablet Take 1 tablet by mouth every 6 (six) hours as needed for moderate pain. 14 tablet 0   ondansetron (ZOFRAN-ODT) 4 MG disintegrating tablet Take 1 tablet (4 mg total) by mouth every 8 (eight) hours as needed for nausea or vomiting. 12 tablet 1    No current facility-administered medications for this visit.     ALLERGIES: Pepto-bismol [bismuth subsalicylate]  No family history on file.  Social History   Socioeconomic History   Marital status: Single    Spouse name: Not on file   Number of children: Not on file   Years of education: Not on file   Highest education level: Not on file  Occupational History   Not on file  Tobacco Use   Smoking status: Never   Smokeless tobacco: Never  Vaping Use   Vaping Use: Never used  Substance and Sexual Activity   Alcohol use: No   Drug use: Yes    Types: Marijuana   Sexual activity: Yes    Birth control/protection: Condom  Other Topics Concern   Not on file  Social History Narrative   Not on file   Social Determinants of Health   Financial Resource Strain: Not on file  Food Insecurity: Not on file  Transportation Needs: Not on file  Physical Activity: Not on file  Stress: Not on file  Social Connections: Not on file  Intimate Partner Violence: Not on  file    Review of Systems  Constitutional:  Positive for weight loss.  Gastrointestinal:  Positive for abdominal pain, diarrhea, nausea and vomiting.   PHYSICAL EXAMINATION:    LMP 08/05/2021 (Approximate)     General appearance: alert, cooperative and appears stated age Abdomen: soft, tender in the RUQ and the RLQ, no rebound, no guarding,  non distended, no masses,  no organomegaly  Pelvic: External genitalia:  no lesions              Urethra:  normal appearing urethra with no masses, tenderness or lesions              Bartholins and Skenes: normal                 Vagina: normal appearing vagina with normal color and discharge, no lesions              Cervix: no cervical motion tenderness and no lesions              Bimanual Exam:  Uterus:  normal size, contour, position, consistency, mobility, non-tender and anteverted              Adnexa:  no masses, tender on the right                Chaperone was present  for exam.   1. Generalized abdominal pain ER notes reviewed, minimally elevated WBC, CT scan with normal uterus and adnexa. She doesn't have a surgical abdomen, but is in tears.  - SURESWAB CT/NG/T. vaginalis - Pregnancy, urine -Recommended she go back to the ER  2. Non-intractable vomiting with nausea, unspecified vomiting type The Zofran isn't working - promethazine (PHENERGAN) 12.5 MG suppository; Place 1 suppository (12.5 mg total) rectally every 6 (six) hours as needed for nausea or vomiting.  Dispense: 12 each; Refill: 1 -Recommended she go back to the ER

## 2021-08-21 ENCOUNTER — Emergency Department (HOSPITAL_COMMUNITY)
Admission: EM | Admit: 2021-08-21 | Discharge: 2021-08-21 | Disposition: A | Discharge status: Home / Self Care | Payer: BC Managed Care – PPO | Attending: Emergency Medicine | Admitting: Emergency Medicine

## 2021-08-21 ENCOUNTER — Emergency Department (HOSPITAL_COMMUNITY): Payer: BC Managed Care – PPO

## 2021-08-21 ENCOUNTER — Encounter (HOSPITAL_COMMUNITY): Payer: Self-pay

## 2021-08-21 DIAGNOSIS — R112 Nausea with vomiting, unspecified: Secondary | ICD-10-CM | POA: Insufficient documentation

## 2021-08-21 DIAGNOSIS — R102 Pelvic and perineal pain: Secondary | ICD-10-CM | POA: Diagnosis not present

## 2021-08-21 DIAGNOSIS — R1031 Right lower quadrant pain: Secondary | ICD-10-CM

## 2021-08-21 LAB — COMPREHENSIVE METABOLIC PANEL
ALT: 26 U/L (ref 0–44)
AST: 24 U/L (ref 15–41)
Albumin: 4.9 g/dL (ref 3.5–5.0)
Alkaline Phosphatase: 41 U/L (ref 38–126)
Anion gap: 13 (ref 5–15)
BUN: 12 mg/dL (ref 6–20)
CO2: 28 mmol/L (ref 22–32)
Calcium: 10.4 mg/dL — ABNORMAL HIGH (ref 8.9–10.3)
Chloride: 100 mmol/L (ref 98–111)
Creatinine, Ser: 0.71 mg/dL (ref 0.44–1.00)
GFR, Estimated: >60 mL/min (ref >60)
Glucose, Bld: 101 mg/dL — ABNORMAL HIGH (ref 70–99)
Potassium: 2.8 mmol/L — ABNORMAL LOW (ref 3.5–5.1)
Sodium: 141 mmol/L (ref 135–145)
Total Bilirubin: 0.9 mg/dL (ref 0.3–1.2)
Total Protein: 9 g/dL — ABNORMAL HIGH (ref 6.5–8.1)

## 2021-08-21 LAB — CBC WITH DIFFERENTIAL/PLATELET
Abs Immature Granulocytes: 0.02 10*3/uL (ref 0.00–0.07)
Basophils Absolute: 0 10*3/uL (ref 0.0–0.1)
Basophils Relative: 0 %
Eosinophils Absolute: 0.1 10*3/uL (ref 0.0–0.5)
Eosinophils Relative: 1 %
HCT: 42.9 % (ref 36.0–46.0)
Hemoglobin: 15 g/dL (ref 12.0–15.0)
Immature Granulocytes: 0 %
Lymphocytes Relative: 29 %
Lymphs Abs: 1.7 10*3/uL (ref 0.7–4.0)
MCH: 30.1 pg (ref 26.0–34.0)
MCHC: 35 g/dL (ref 30.0–36.0)
MCV: 86.1 fL (ref 80.0–100.0)
Monocytes Absolute: 0.6 10*3/uL (ref 0.1–1.0)
Monocytes Relative: 11 %
Neutro Abs: 3.3 10*3/uL (ref 1.7–7.7)
Neutrophils Relative %: 59 %
Platelets: 313 10*3/uL (ref 150–400)
RBC: 4.98 MIL/uL (ref 3.87–5.11)
RDW: 12.6 % (ref 11.5–15.5)
WBC: 5.6 10*3/uL (ref 4.0–10.5)
nRBC: 0 % (ref 0.0–0.2)

## 2021-08-21 LAB — LIPASE, BLOOD: Lipase: 35 U/L (ref 11–51)

## 2021-08-21 MED ORDER — METOCLOPRAMIDE HCL 10 MG PO TABS: 10.0000 mg | ORAL_TABLET | Freq: Four times a day (QID) | ORAL | 0 refills | Status: DC

## 2021-08-21 MED ORDER — POTASSIUM CHLORIDE 10 MEQ/100ML IV SOLN: 10.0000 meq | INTRAVENOUS | Status: DC

## 2021-08-21 MED ORDER — ONDANSETRON 4 MG PO TBDP: 4.0000 mg | ORAL_TABLET | Freq: Three times a day (TID) | ORAL | 0 refills | Status: DC | PRN

## 2021-08-21 MED ORDER — ONDANSETRON HCL 4 MG/2ML IJ SOLN
4.0000 mg | Freq: Once | INTRAMUSCULAR | Status: AC
  Administered 2021-08-21: 4 mg via INTRAVENOUS
  Filled 2021-08-21: qty 2

## 2021-08-21 MED ORDER — LACTATED RINGERS IV BOLUS
1000.0000 mL | Freq: Once | INTRAVENOUS | Status: AC
  Administered 2021-08-21: 1000 mL via INTRAVENOUS

## 2021-08-21 MED ORDER — POTASSIUM CHLORIDE CRYS ER 20 MEQ PO TBCR: 20.0000 meq | EXTENDED_RELEASE_TABLET | Freq: Every day | ORAL | 0 refills | Status: DC

## 2021-08-21 MED ORDER — KETOROLAC TROMETHAMINE 15 MG/ML IJ SOLN
15.0000 mg | Freq: Once | INTRAMUSCULAR | Status: AC
  Administered 2021-08-21: 15 mg via INTRAVENOUS
  Filled 2021-08-21: qty 1

## 2021-08-21 MED ORDER — POTASSIUM CHLORIDE CRYS ER 10 MEQ PO TBCR
10.0000 meq | EXTENDED_RELEASE_TABLET | Freq: Once | ORAL | Status: AC
  Administered 2021-08-21: 10 meq via ORAL
  Filled 2021-08-21: qty 1

## 2021-08-21 NOTE — ED Triage Notes (Signed)
Pt c/o diffuse abdominal pain, nausea, and vomiting x 6 days.

## 2021-08-21 NOTE — Discharge Instructions (Addendum)
You were seen in the ER today for your belly pain, nausea, and vomiting.  While the exact cause of your remains unclear, you are feeling much improved after Zofran.  Your right lower belly pain is likely secondary to the ovarian cyst identified on the ultrasound.  Please follow-up with your OB/GYN for this.  You have been prescribed a medication called Reglan to help with your abdominal cramping.  Take this as needed.  Return to the ER with any worsening abdominal pain, nausea or vomiting does not stop, or any other new severe symptom.

## 2021-08-21 NOTE — ED Provider Notes (Signed)
[facility]-EMERGENCY DEPT Provider Note   CSN: [id number] Arrival date & time: 08/21/21  0947     History Chief Complaint  Patient presents with   Abdominal Pain   Nausea    Christina Mays is a 20 y.o. female who presents with concern for diffuse abdominal pain, nausea, vomiting x6 days.  Pain worse in the right lower quadrant.  Patient was seen at [facility] on 9/14 for the same symptoms.  She improved after administration of Zofran and Toradol and had normal CT at that time.  Leukocytosis of 11,000.  She states that the nausea does improve with Zofran developing does not.  She presents with her fianc at the bedside.  Patient states that this time her pain is worse in the right lower quadrant where she has history of ovarian cysts.  She denies any fevers, chills, diarrhea.  I personally reviewed this patient's medical records.  She has history of migraines with aura and ovarian cysts.  Was discharged with Zofran and Norco on 9/14.   HPI     Past Medical History:  Diagnosis Date   Migraine with aura     Patient Active Problem List   Diagnosis Date Noted   Migraine with aura     Past Surgical History:  Procedure Laterality Date   MOUTH SURGERY     UMBILICAL HERNIA REPAIR       OB History     Gravida  0   Para  0   Term  0   Preterm  0   AB  0   Living  0      SAB  0   IAB  0   Ectopic  0   Multiple  0   Live Births  0           No family history on file.  Social History   Tobacco Use   Smoking status: Never   Smokeless tobacco: Never  Vaping Use   Vaping Use: Never used  Substance Use Topics   Alcohol use: No   Drug use: Yes    Types: Marijuana    Home Medications Prior to Admission medications   Medication Sig Start Date End Date Taking? Authorizing Provider  famotidine (PEPCID) 20 MG tablet TAKE 1 TABLET BY MOUTH TWICE A DAY 12/09/19  Yes Cirigliano, Mary K, DO  HYDROcodone-acetaminophen (NORCO/VICODIN)  5-325 MG tablet Take 1 tablet by mouth every 6 (six) hours as needed for moderate pain. 08/18/21  Yes Vanetta Mulders, MD  metoCLOPramide (REGLAN) 10 MG tablet Take 1 tablet (10 mg total) by mouth every 6 (six) hours. 08/21/21  Yes Raiyan Dalesandro R, PA-C  ondansetron (ZOFRAN ODT) 4 MG disintegrating tablet Take 1 tablet (4 mg total) by mouth every 8 (eight) hours as needed for nausea or vomiting. 08/21/21  Yes Karman Biswell R, PA-C  potassium chloride SA (KLOR-CON) 20 MEQ tablet Take 1 tablet (20 mEq total) by mouth daily for 3 doses. 08/21/21 08/24/21 Yes Kean Gautreau, Eugene Gavia, PA-C  promethazine (PHENERGAN) 12.5 MG suppository Place 1 suppository (12.5 mg total) rectally every 6 (six) hours as needed for nausea or vomiting. 08/20/21  Yes Romualdo Bolk, MD  dicyclomine (BENTYL) 10 MG capsule Take 1 capsule (10 mg total) by mouth 3 (three) times daily before meals. Patient not taking: No sig reported 10/23/19   Overton Mam, DO    Allergies    Pepto-bismol [bismuth subsalicylate]  Review of Systems   Review of Systems  Constitutional:  Positive for appetite change. Negative for activity change, chills, fatigue and fever.  HENT: Negative.    Eyes: Negative.   Respiratory: Negative.    Cardiovascular: Negative.   Gastrointestinal:  Positive for abdominal pain, nausea and vomiting. Negative for diarrhea.  Genitourinary: Negative.   Musculoskeletal: Negative.   Skin: Negative.   Neurological: Negative.   Hematological: Negative.    Physical Exam Updated Vital Signs BP (!) 144/72   Pulse 74   Temp 98.5 F (36.9 C) (Oral)   Resp 15   LMP 08/05/2021 (Approximate)   SpO2 100%   Physical Exam Vitals and nursing note reviewed.  Constitutional:      Appearance: She is normal weight. She is not ill-appearing or toxic-appearing.  HENT:     Head: Normocephalic and atraumatic.     Nose: Nose normal.     Mouth/Throat:     Mouth: Mucous membranes are moist.     Pharynx:  Oropharynx is clear. Uvula midline. No oropharyngeal exudate or posterior oropharyngeal erythema.     Tonsils: No tonsillar exudate.  Eyes:     General: Lids are normal. Vision grossly intact.        Right eye: No discharge.        Left eye: No discharge.     Extraocular Movements: Extraocular movements intact.     Conjunctiva/sclera: Conjunctivae normal.     Pupils: Pupils are equal, round, and reactive to light.  Neck:     Trachea: Trachea and phonation normal.  Cardiovascular:     Rate and Rhythm: Normal rate and regular rhythm.     Pulses: Normal pulses.     Heart sounds: Normal heart sounds. No murmur heard. Pulmonary:     Effort: Pulmonary effort is normal. No tachypnea, bradypnea, accessory muscle usage, prolonged expiration or respiratory distress.     Breath sounds: Normal breath sounds. No wheezing or rales.  Chest:     Chest wall: No mass, lacerations, deformity, swelling, tenderness, crepitus or edema.  Abdominal:     General: Bowel sounds are normal. There is no distension.     Palpations: Abdomen is soft.     Tenderness: There is generalized abdominal tenderness and tenderness in the right lower quadrant. There is no right CVA tenderness, left CVA tenderness, guarding or rebound. Negative signs include McBurney's sign.    Musculoskeletal:        General: No deformity.     Cervical back: Normal range of motion and neck supple. No edema, rigidity or crepitus. No pain with movement, spinous process tenderness or muscular tenderness.     Right lower leg: No edema.     Left lower leg: No edema.  Lymphadenopathy:     Cervical: No cervical adenopathy.  Skin:    General: Skin is warm and dry.  Neurological:     Mental Status: She is alert. Mental status is at baseline.  Psychiatric:        Mood and Affect: Mood normal.    ED Results / Procedures / Treatments   Labs (all labs ordered are listed, but only abnormal results are displayed) Labs Reviewed  COMPREHENSIVE  METABOLIC PANEL - Abnormal; Notable for the following components:      Result Value   Potassium 2.8 (*)    Glucose, Bld 101 (*)    Calcium 10.4 (*)    Total Protein 9.0 (*)    All other components within normal limits  LIPASE, BLOOD  CBC WITH DIFFERENTIAL/PLATELET  URINALYSIS, ROUTINE W  REFLEX MICROSCOPIC  I-STAT BETA HCG BLOOD, ED ([facility], [facility], [facility] ONLY)    EKG None  Radiology US PELVIC DOPPLER (TORSION R/O OR MASS ARTERIAL FLOW)  Result Date: 08/21/2021 CLINICAL DATA:  Right lower quadrant pain for 6 days. EXAM: TRANSABDOMINAL AND TRANSVAGINAL ULTRASOUND OF PELVIS DOPPLER ULTRASOUND OF OVARIES TECHNIQUE: Both transabdominal and transvaginal ultrasound examinations of the pelvis were performed. Transabdominal technique was performed for global imaging of the pelvis including uterus, ovaries, adnexal regions, and pelvic cul-de-sac. It was necessary to proceed with endovaginal exam following the transabdominal exam to visualize the endometrium and ovaries. Color and duplex Doppler ultrasound was utilized to evaluate blood flow to the ovaries. COMPARISON:  CT scan August 18, 2021 FINDINGS: Uterus Measurements: 7.3 x 3.4 x 4.4 cm = volume: 57.3 mL. No fibroids or other mass visualized. Endometrium Thickness: 7.0 mm.  No focal abnormality visualized. Right ovary Measurements: 4.2 x 2.9 x 3.3 cm = volume: 20.6 mL. Contains a 1.7 cm corpus luteum or hemorrhagic cyst. Left ovary Measurements: 3.2 x 2.1 x 2.7 cm = volume: 9.9 mL. Normal appearance/no adnexal mass. Pulsed Doppler evaluation of both ovaries demonstrates normal low-resistance arterial and venous waveforms. Other findings Trace physiologic fluid in the pelvis. IMPRESSION: 1. Corpus luteum cyst in the right ovary, possibly explaining the patient's symptoms. 2. No other abnormalities. Electronically Signed   By: Gerome Sam III M.D.   On: 08/21/2021 13:19   US PELVIC COMPLETE WITH TRANSVAGINAL  Result Date: 08/21/2021 CLINICAL DATA:  Right  lower quadrant pain for 6 days. EXAM: TRANSABDOMINAL AND TRANSVAGINAL ULTRASOUND OF PELVIS DOPPLER ULTRASOUND OF OVARIES TECHNIQUE: Both transabdominal and transvaginal ultrasound examinations of the pelvis were performed. Transabdominal technique was performed for global imaging of the pelvis including uterus, ovaries, adnexal regions, and pelvic cul-de-sac. It was necessary to proceed with endovaginal exam following the transabdominal exam to visualize the endometrium and ovaries. Color and duplex Doppler ultrasound was utilized to evaluate blood flow to the ovaries. COMPARISON:  CT scan August 18, 2021 FINDINGS: Uterus Measurements: 7.3 x 3.4 x 4.4 cm = volume: 57.3 mL. No fibroids or other mass visualized. Endometrium Thickness: 7.0 mm.  No focal abnormality visualized. Right ovary Measurements: 4.2 x 2.9 x 3.3 cm = volume: 20.6 mL. Contains a 1.7 cm corpus luteum or hemorrhagic cyst. Left ovary Measurements: 3.2 x 2.1 x 2.7 cm = volume: 9.9 mL. Normal appearance/no adnexal mass. Pulsed Doppler evaluation of both ovaries demonstrates normal low-resistance arterial and venous waveforms. Other findings Trace physiologic fluid in the pelvis. IMPRESSION: 1. Corpus luteum cyst in the right ovary, possibly explaining the patient's symptoms. 2. No other abnormalities. Electronically Signed   By: Gerome Sam III M.D.   On: 08/21/2021 13:19    Procedures Procedures   Medications Ordered in ED Medications  ondansetron (ZOFRAN) injection 4 mg (4 mg Intravenous Given 08/21/21 1035)  lactated ringers bolus 1,000 mL (0 mLs Intravenous Stopped 08/21/21 1404)  ketorolac (TORADOL) 15 MG/ML injection 15 mg (15 mg Intravenous Given 08/21/21 1247)  potassium chloride (KLOR-CON) CR tablet 10 mEq (10 mEq Oral Given 08/21/21 1402)    ED Course  I have reviewed the triage vital signs and the nursing notes.  Pertinent labs & imaging results that were available during my care of the patient were reviewed by me and  considered in my medical decision making (see chart for details).    MDM Rules/Calculators/A&P  20 year old female presents with concern for 6 days of persistent nausea, vomiting, and abdominal pain generalized with some localization in the right lower quadrant.  Symptoms relieved with Zofran.  Acute gastroenteritis, cannabis hyperemesis syndrome, pyelonephritis, UTI, ovarian cyst or torsion, STI, appendicitis, nephrolithiasis/ureterolithiasis, intra-abdominal mass.  Hypertensive on edema medicine/normal.  Cardiopulmonary exam is normal, abdominal exam with generalized abdominal tenderness palpation, worse in the right lower pelvis without McBurney's point tenderness palpation, rebound, or guarding.  Patient is neurovascular intact in all 4 extremities.  Small amount of emesis in emesis bag at bedside.  CBC unremarkable, CMP with hypokalemia of 2.8.  Lipase is normal.  Pelvic ultrasounds did reveal corpus luteum cyst on the right ovary without ovarian torsion.  Patient reevaluated after administration of antiemetic and Toradol with improvement in her symptoms.  Patient with normal CT on 9/14 without any evidence of appendicitis.  Given presence of ovarian cyst on the right ovary and lack of tenderness palpation of McBurney's point, as well as chronicity of symptoms without worsening do not feel patient's suggest appendicitis at this time.  Discussion of repeat CT scan was had with patient who prefers to avoid CT at this time given she is feeling much improved after Zofran and Toradol.  She is requesting be discharged home.  P.o. challenge successful patient able to take oral potassium, therefore IV repletion canceled.  Will discharge with prescription for Reglan and oral potassium at home for the next few days.  The exact etiology of her nausea vomiting remains unclear, there is not appear to be any emergent etiology at this time.  Did have extensive discussion with her regarding  role of cessation of cannabis use.  Recommend close OB/GYN follow-up for cyst.  Christina Mays voiced understanding of her medical evaluation and treatment plan.  Each of her questions was answered to her expressed satisfaction.  Return precautions were given.  Patient is well-appearing, stable, and appropriate for discharge at this time.  This chart was dictated using voice recognition software, Dragon. Despite the best efforts of this provider to proofread and correct errors, errors may still occur which can change documentation meaning.  Final Clinical Impression(s) / ED Diagnoses Final diagnoses:  RLQ abdominal pain    Rx / DC Orders ED Discharge Orders          Ordered    potassium chloride SA (KLOR-CON) 20 MEQ tablet  Daily        08/21/21 1357    metoCLOPramide (REGLAN) 10 MG tablet  Every 6 hours        08/21/21 1357    ondansetron (ZOFRAN ODT) 4 MG disintegrating tablet  Every 8 hours PRN        08/21/21 1357             Matty Vanroekel, Eugene Gavia, PA-C 08/21/21 1429    Lorre Nick, MD 08/21/21 1446

## 2021-08-23 LAB — SURESWAB CT/NG/T. VAGINALIS
C. trachomatis RNA, TMA: NOT DETECTED
N. gonorrhoeae RNA, TMA: NOT DETECTED
Trichomonas vaginalis RNA: NOT DETECTED

## 2021-09-17 ENCOUNTER — Other Ambulatory Visit: Payer: Self-pay

## 2021-09-17 ENCOUNTER — Encounter (HOSPITAL_BASED_OUTPATIENT_CLINIC_OR_DEPARTMENT_OTHER): Payer: Self-pay

## 2021-09-17 ENCOUNTER — Emergency Department (HOSPITAL_BASED_OUTPATIENT_CLINIC_OR_DEPARTMENT_OTHER)
Admission: EM | Admit: 2021-09-17 | Discharge: 2021-09-17 | Disposition: A | Discharge status: Home / Self Care | Payer: BC Managed Care – PPO | Attending: Emergency Medicine | Admitting: Emergency Medicine

## 2021-09-17 DIAGNOSIS — R1084 Generalized abdominal pain: Secondary | ICD-10-CM | POA: Insufficient documentation

## 2021-09-17 DIAGNOSIS — R112 Nausea with vomiting, unspecified: Secondary | ICD-10-CM | POA: Insufficient documentation

## 2021-09-17 HISTORY — DX: Unspecified ovarian cyst, unspecified side: N83.209

## 2021-09-17 LAB — PREGNANCY, URINE: Preg Test, Ur: NEGATIVE

## 2021-09-17 LAB — CBC
HCT: 41 % (ref 36.0–46.0)
Hemoglobin: 14.4 g/dL (ref 12.0–15.0)
MCH: 30 pg (ref 26.0–34.0)
MCHC: 35.1 g/dL (ref 30.0–36.0)
MCV: 85.4 fL (ref 80.0–100.0)
Platelets: 282 10*3/uL (ref 150–400)
RBC: 4.8 MIL/uL (ref 3.87–5.11)
RDW: 13.1 % (ref 11.5–15.5)
WBC: 10.1 10*3/uL (ref 4.0–10.5)
nRBC: 0 % (ref 0.0–0.2)

## 2021-09-17 LAB — URINALYSIS, ROUTINE W REFLEX MICROSCOPIC
Bilirubin Urine: NEGATIVE
Glucose, UA: NEGATIVE mg/dL
Ketones, ur: 40 mg/dL — AB
Leukocytes,Ua: NEGATIVE
Nitrite: NEGATIVE
Protein, ur: 30 mg/dL — AB
Specific Gravity, Urine: 1.021 (ref 1.005–1.030)
pH: 6.5 (ref 5.0–8.0)

## 2021-09-17 LAB — COMPREHENSIVE METABOLIC PANEL
ALT: 36 U/L (ref 0–44)
AST: 24 U/L (ref 15–41)
Albumin: 5 g/dL (ref 3.5–5.0)
Alkaline Phosphatase: 38 U/L (ref 38–126)
Anion gap: 14 (ref 5–15)
BUN: 9 mg/dL (ref 6–20)
CO2: 29 mmol/L (ref 22–32)
Calcium: 10.7 mg/dL — ABNORMAL HIGH (ref 8.9–10.3)
Chloride: 94 mmol/L — ABNORMAL LOW (ref 98–111)
Creatinine, Ser: 0.59 mg/dL (ref 0.44–1.00)
GFR, Estimated: >60 mL/min (ref >60)
Glucose, Bld: 130 mg/dL — ABNORMAL HIGH (ref 70–99)
Potassium: 2.5 mmol/L — CL (ref 3.5–5.1)
Sodium: 137 mmol/L (ref 135–145)
Total Bilirubin: 0.7 mg/dL (ref 0.3–1.2)
Total Protein: 8.5 g/dL — ABNORMAL HIGH (ref 6.5–8.1)

## 2021-09-17 LAB — LIPASE, BLOOD: Lipase: <10 U/L — ABNORMAL LOW (ref 11–51)

## 2021-09-17 MED ORDER — METOCLOPRAMIDE HCL 10 MG PO TABS: 10.0000 mg | ORAL_TABLET | Freq: Four times a day (QID) | ORAL | 0 refills | Status: DC | PRN

## 2021-09-17 MED ORDER — POTASSIUM CHLORIDE 10 MEQ/100ML IV SOLN
10.0000 meq | INTRAVENOUS | Status: AC
Start: 2021-09-17 — End: 2021-09-17
  Administered 2021-09-17 (×3): 10 meq via INTRAVENOUS
  Filled 2021-09-17 (×3): qty 100

## 2021-09-17 MED ORDER — MORPHINE SULFATE (PF) 4 MG/ML IV SOLN
4.0000 mg | Freq: Once | INTRAVENOUS | Status: AC
  Administered 2021-09-17: 4 mg via INTRAVENOUS
  Filled 2021-09-17: qty 1

## 2021-09-17 MED ORDER — LACTATED RINGERS IV BOLUS
1000.0000 mL | Freq: Once | INTRAVENOUS | Status: AC
  Administered 2021-09-17: 1000 mL via INTRAVENOUS

## 2021-09-17 MED ORDER — LACTATED RINGERS IV SOLN: INTRAVENOUS | Status: DC

## 2021-09-17 MED ORDER — METOCLOPRAMIDE HCL 5 MG/ML IJ SOLN
10.0000 mg | Freq: Once | INTRAMUSCULAR | Status: AC
  Administered 2021-09-17: 10 mg via INTRAVENOUS
  Filled 2021-09-17: qty 2

## 2021-09-17 NOTE — Discharge Instructions (Addendum)
Your potassium levels were low today but otherwise everything else looked normal.  Your vomiting may be related to using marijuana so you can try stop using but sometimes it takes up to 6 months to know if it is going to work.  You can use the Reglan as needed for the nausea and vomiting.  If symptoms do not improve you may need to follow-up with a gastroenterologist.

## 2021-09-17 NOTE — ED Provider Notes (Signed)
[facility] EMERGENCY DEPT Provider Note   CSN: [id number] Arrival date & time: 09/17/21  1329     History Chief Complaint  Patient presents with   Abdominal Pain   Emesis    Christina Mays is a 20 y.o. female.  Patient is a 20 year old female with a history of migraines, ovarian cyst and multiple emergency room visits for persistent nausea and vomiting who is presenting today with abdominal pain nausea and vomiting.  Symptoms started approximately 3 days ago.  She states her abdomen now hurts everywhere because she has had emesis greater than 10 times per day for the last 3 days and cannot hold anything down but reports its worse in the right lower quadrant.  She did have ultrasounds done less than 1 month ago that showed that she had a corpus luteal cyst in her right ovary but no other complicating features.  Patient is actively vomiting on exam but otherwise is awake and alert.  She denies any urinary symptoms, vaginal discharge but does report that she is currently on her menses.  She does use marijuana but denies any alcohol or tobacco use.  She takes no medications regularly.  In the past when she is been seen in the emergency room she did improve with Zofran and Toradol.  The history is provided by the patient and medical records.  Abdominal Pain Associated symptoms: vomiting   Emesis Associated symptoms: abdominal pain       Past Medical History:  Diagnosis Date   Migraine with aura    Ovarian cyst     Patient Active Problem List   Diagnosis Date Noted   Migraine with aura     Past Surgical History:  Procedure Laterality Date   MOUTH SURGERY     UMBILICAL HERNIA REPAIR       OB History     Gravida  0   Para  0   Term  0   Preterm  0   AB  0   Living  0      SAB  0   IAB  0   Ectopic  0   Multiple  0   Live Births  0           History reviewed. No pertinent family history.  Social History   Tobacco Use   Smoking  status: Never   Smokeless tobacco: Never  Vaping Use   Vaping Use: Never used  Substance Use Topics   Alcohol use: No   Drug use: Yes    Types: Marijuana    Home Medications Prior to Admission medications   Medication Sig Start Date End Date Taking? Authorizing Provider  dicyclomine (BENTYL) 10 MG capsule Take 1 capsule (10 mg total) by mouth 3 (three) times daily before meals. Patient not taking: No sig reported 10/23/19   Cirigliano, Corrie Dandy K, DO  famotidine (PEPCID) 20 MG tablet TAKE 1 TABLET BY MOUTH TWICE A DAY 12/09/19   Cirigliano, Mary K, DO  HYDROcodone-acetaminophen (NORCO/VICODIN) 5-325 MG tablet Take 1 tablet by mouth every 6 (six) hours as needed for moderate pain. 08/18/21   Vanetta Mulders, MD  metoCLOPramide (REGLAN) 10 MG tablet Take 1 tablet (10 mg total) by mouth every 6 (six) hours. 08/21/21   Sponseller, Lupe Carney R, PA-C  ondansetron (ZOFRAN ODT) 4 MG disintegrating tablet Take 1 tablet (4 mg total) by mouth every 8 (eight) hours as needed for nausea or vomiting. 08/21/21   Sponseller, Lupe Carney R, PA-C  potassium chloride SA (  KLOR-CON) 20 MEQ tablet Take 1 tablet (20 mEq total) by mouth daily for 3 doses. 08/21/21 08/24/21  Sponseller, Eugene Gavia, PA-C  promethazine (PHENERGAN) 12.5 MG suppository Place 1 suppository (12.5 mg total) rectally every 6 (six) hours as needed for nausea or vomiting. 08/20/21   Romualdo Bolk, MD    Allergies    Pepto-bismol [bismuth subsalicylate]  Review of Systems   Review of Systems  Gastrointestinal:  Positive for abdominal pain and vomiting.  All other systems reviewed and are negative.  Physical Exam Updated Vital Signs BP (!) 126/95   Pulse 72   Temp 98.7 F (37.1 C)   Resp 18   Ht 5' (1.524 m)   LMP 09/14/2021   SpO2 100%   BMI 19.53 kg/m   Physical Exam Vitals and nursing note reviewed.  Constitutional:      General: She is not in acute distress.    Appearance: She is well-developed.     Comments: Actively vomiting   HENT:     Head: Normocephalic and atraumatic.     Mouth/Throat:     Mouth: Mucous membranes are dry.  Eyes:     Pupils: Pupils are equal, round, and reactive to light.  Cardiovascular:     Rate and Rhythm: Normal rate and regular rhythm.     Heart sounds: Normal heart sounds. No murmur heard.   No friction rub.  Pulmonary:     Effort: Pulmonary effort is normal.     Breath sounds: Normal breath sounds. No wheezing or rales.  Abdominal:     General: Bowel sounds are normal. There is no distension.     Palpations: Abdomen is soft.     Tenderness: There is generalized abdominal tenderness. There is no right CVA tenderness, left CVA tenderness, guarding or rebound.     Comments: Generalized abdominal tenderness mildly worse in the right lower quadrant.  No rebound or guarding.  Musculoskeletal:        General: No tenderness. Normal range of motion.     Right lower leg: No edema.     Left lower leg: No edema.     Comments: No edema  Skin:    General: Skin is warm and dry.     Findings: No rash.  Neurological:     Mental Status: She is alert and oriented to person, place, and time. Mental status is at baseline.     Cranial Nerves: No cranial nerve deficit.  Psychiatric:        Mood and Affect: Mood normal.        Behavior: Behavior normal.    ED Results / Procedures / Treatments   Labs (all labs ordered are listed, but only abnormal results are displayed) Labs Reviewed  LIPASE, BLOOD - Abnormal; Notable for the following components:      Result Value   Lipase <10 (*)    All other components within normal limits  COMPREHENSIVE METABOLIC PANEL - Abnormal; Notable for the following components:   Potassium 2.5 (*)    Chloride 94 (*)    Glucose, Bld 130 (*)    Calcium 10.7 (*)    Total Protein 8.5 (*)    All other components within normal limits  CBC  URINALYSIS, ROUTINE W REFLEX MICROSCOPIC  PREGNANCY, URINE    EKG None  Radiology No results  found.  Procedures Procedures   Medications Ordered in ED Medications  potassium chloride 10 mEq in 100 mL IVPB (10 mEq Intravenous New Bag/Given 09/17/21 1723)  lactated ringers infusion (has no administration in time range)  metoCLOPramide (REGLAN) injection 10 mg (10 mg Intravenous Given 09/17/21 1611)  lactated ringers bolus 1,000 mL (1,000 mLs Intravenous New Bag/Given 09/17/21 1618)  morphine 4 MG/ML injection 4 mg (4 mg Intravenous Given 09/17/21 1613)    ED Course  I have reviewed the triage vital signs and the nursing notes.  Pertinent labs & imaging results that were available during my care of the patient were reviewed by me and considered in my medical decision making (see chart for details).    MDM Rules/Calculators/A&P                           Patient presenting today with non-intractable nausea and vomiting for the last 3 days at home.  This is been a recurrent issue for the patient and concern for cannabis hyperemesis syndrome.  She does have a history of having ovarian cysts but they are corpus luteal cyst and did not appear severe.  She is on her menses currently but denies any urinary or vaginal symptoms.  She has had CT done last month without acute findings and low suspicion that this is renal stone.  Patient's lipase is less than 10, CMP with hypokalemia today at 2.5 and chloride of 94.  Creatinine is within normal limits and anion gap of 14.  CBC without acute findings.  Will treat with Reglan, pain control and fluids.  Patient's potassium will be replaced.  We will reevaluate after therapy given.  7:27 PM Pt feeling better after meds and tolerating po's.  Completed potassium and drinking fluids in the room.  Pain resolved.  Will d/c home with anti-emetics.  MDM   Amount and/or Complexity of Data Reviewed Clinical lab tests: ordered and reviewed Independent visualization of images, tracings, or specimens: yes      Final Clinical Impression(s) / ED  Diagnoses Final diagnoses:  Nausea and vomiting, unspecified vomiting type    Rx / DC Orders ED Discharge Orders     None        Gwyneth Sprout, MD 09/17/21 2332

## 2021-09-17 NOTE — ED Notes (Signed)
Pt verbalizes understanding of discharge instructions. Opportunity for questioning and answers were provided. Armand removed by staff, pt discharged from ED to home. Educated to f/u with PCP.  

## 2021-09-17 NOTE — ED Triage Notes (Signed)
Pt reports severe abdominal pain from ovarian cysts that is causing her to vomit uncontrollably for 2 days. Denies diarrhea.

## 2021-09-19 ENCOUNTER — Other Ambulatory Visit: Payer: Self-pay

## 2021-09-19 ENCOUNTER — Encounter (HOSPITAL_COMMUNITY): Payer: Self-pay

## 2021-09-19 ENCOUNTER — Emergency Department (HOSPITAL_COMMUNITY)
Admission: EM | Admit: 2021-09-19 | Discharge: 2021-09-19 | Disposition: A | Discharge status: Home / Self Care | Payer: BC Managed Care – PPO | Attending: Emergency Medicine | Admitting: Emergency Medicine

## 2021-09-19 DIAGNOSIS — R102 Pelvic and perineal pain: Secondary | ICD-10-CM

## 2021-09-19 DIAGNOSIS — N83209 Unspecified ovarian cyst, unspecified side: Secondary | ICD-10-CM | POA: Insufficient documentation

## 2021-09-19 MED ORDER — ACETAMINOPHEN 325 MG PO TABS
650.0000 mg | ORAL_TABLET | Freq: Once | ORAL | Status: AC
  Administered 2021-09-19: 650 mg via ORAL
  Filled 2021-09-19: qty 2

## 2021-09-19 MED ORDER — NAPROXEN 500 MG PO TABS
500.0000 mg | ORAL_TABLET | Freq: Once | ORAL | Status: AC
  Administered 2021-09-19: 500 mg via ORAL
  Filled 2021-09-19: qty 1

## 2021-09-19 MED ORDER — ONDANSETRON 8 MG PO TBDP: 8.0000 mg | ORAL_TABLET | Freq: Three times a day (TID) | ORAL | 0 refills | Status: DC | PRN

## 2021-09-19 MED ORDER — DICYCLOMINE HCL 20 MG PO TABS: 20.0000 mg | ORAL_TABLET | Freq: Two times a day (BID) | ORAL | 0 refills | Status: DC

## 2021-09-19 NOTE — Discharge Instructions (Signed)
We saw you in the ER for pelvic pain.  As discussed, the ultrasound and CT scan you had last month were reassuring.  We suspect that the pain is because of the ovarian cyst.  Please follow-up with the gynecology team by setting up an appointment by calling the number provided.  Take ibuprofen 400 mg every 4-6 hours for pain control along with Tylenol.

## 2021-09-19 NOTE — ED Triage Notes (Signed)
Pt states that she has ovarian cysts. Pt states that she has been having emesis and can't keep food down. Pt states that she went to OBGYN, who stated she didn't have cysts.

## 2021-09-19 NOTE — ED Provider Notes (Signed)
[facility]-EMERGENCY DEPT Provider Note   CSN: [id number] Arrival date & time: 09/19/21  1004     History Chief Complaint  Patient presents with   Pelvic Pain   Emesis    Christina Mays is a 20 y.o. female.  HPI    20 year old female comes in with chief complaint of pelvic pain.  Patient has history of migraine and ovarian cyst.  She does not have abdominal migraine.  She reports that her current abdominal pain started 2 days ago.  She is currently on her period.  She had similar pain last month when she had come to the ER.  She also was seen in the ER just 3 days back.  Her abdominal pain is in the lower quadrant.  She denies any vaginal discharge, bleeding.  She denies any UTI-like symptoms.  Patient was having some nausea and vomiting, currently not having severe nausea.  Although in triage she reported that she was told she does not have ovarian cyst, she has not seen an OB/GYN.  Past Medical History:  Diagnosis Date   Migraine with aura    Ovarian cyst     Patient Active Problem List   Diagnosis Date Noted   Migraine with aura     Past Surgical History:  Procedure Laterality Date   MOUTH SURGERY     UMBILICAL HERNIA REPAIR       OB History     Gravida  0   Para  0   Term  0   Preterm  0   AB  0   Living  0      SAB  0   IAB  0   Ectopic  0   Multiple  0   Live Births  0           History reviewed. No pertinent family history.  Social History   Tobacco Use   Smoking status: Never   Smokeless tobacco: Never  Vaping Use   Vaping Use: Never used  Substance Use Topics   Alcohol use: No   Drug use: Yes    Types: Marijuana    Home Medications Prior to Admission medications   Medication Sig Start Date End Date Taking? Authorizing Provider  dicyclomine (BENTYL) 20 MG tablet Take 1 tablet (20 mg total) by mouth 2 (two) times daily. 09/19/21  Yes Dixon Luczak, MD  ondansetron (ZOFRAN ODT) 8 MG  disintegrating tablet Take 1 tablet (8 mg total) by mouth every 8 (eight) hours as needed for nausea. 09/19/21  Yes Derwood Kaplan, MD  dicyclomine (BENTYL) 10 MG capsule Take 1 capsule (10 mg total) by mouth 3 (three) times daily before meals. Patient not taking: No sig reported 10/23/19   Cirigliano, Corrie Dandy K, DO  famotidine (PEPCID) 20 MG tablet TAKE 1 TABLET BY MOUTH TWICE A DAY 12/09/19   Cirigliano, Mary K, DO  HYDROcodone-acetaminophen (NORCO/VICODIN) 5-325 MG tablet Take 1 tablet by mouth every 6 (six) hours as needed for moderate pain. 08/18/21   Vanetta Mulders, MD  metoCLOPramide (REGLAN) 10 MG tablet Take 1 tablet (10 mg total) by mouth every 6 (six) hours as needed for nausea or vomiting. 09/17/21   Gwyneth Sprout, MD  potassium chloride SA (KLOR-CON) 20 MEQ tablet Take 1 tablet (20 mEq total) by mouth daily for 3 doses. 08/21/21 08/24/21  Sponseller, Eugene Gavia, PA-C  promethazine (PHENERGAN) 12.5 MG suppository Place 1 suppository (12.5 mg total) rectally every 6 (six) hours as needed for nausea or  vomiting. 08/20/21   Romualdo Bolk, MD    Allergies    Pepto-bismol [bismuth subsalicylate]  Review of Systems   Review of Systems  Constitutional:  Positive for activity change.  Respiratory:  Negative for shortness of breath.   Cardiovascular:  Negative for chest pain.  Gastrointestinal:  Positive for abdominal pain, nausea and vomiting.  Genitourinary:  Negative for dysuria, vaginal bleeding and vaginal discharge.  All other systems reviewed and are negative.  Physical Exam Updated Vital Signs BP 128/72   Pulse 80   Temp 98.7 F (37.1 C) (Oral)   Resp 16   Ht 5' (1.524 m)   Wt 45.4 kg   LMP 09/14/2021   SpO2 97%   BMI 19.53 kg/m   Physical Exam Vitals and nursing note reviewed.  Constitutional:      Appearance: She is well-developed.  HENT:     Head: Atraumatic.  Cardiovascular:     Rate and Rhythm: Normal rate.  Pulmonary:     Effort: Pulmonary effort is  normal.  Abdominal:     Palpations: Abdomen is soft.     Tenderness: There is abdominal tenderness. There is no guarding or rebound.  Musculoskeletal:     Cervical back: Normal range of motion and neck supple.  Skin:    General: Skin is warm and dry.  Neurological:     Mental Status: She is alert and oriented to person, place, and time.    ED Results / Procedures / Treatments   Labs (all labs ordered are listed, but only abnormal results are displayed) Labs Reviewed - No data to display  EKG None  Radiology No results found.  Procedures Procedures   Medications Ordered in ED Medications  naproxen (NAPROSYN) tablet 500 mg (500 mg Oral Given 09/19/21 1113)  acetaminophen (TYLENOL) tablet 650 mg (650 mg Oral Given 09/19/21 1112)    ED Course  I have reviewed the triage vital signs and the nursing notes.  Pertinent labs & imaging results that were available during my care of the patient were reviewed by me and considered in my medical decision making (see chart for details).    MDM Rules/Calculators/A&P                           20 year old comes in with chief complaint of abdominal pain.  Patient has lower quadrant abdominal pain with nausea, vomiting.  She has had similar symptoms in the recent past for which she had CT scan and ultrasound done last month.  She was seen in the ER just few days back and was thought to be having hyperemesis secondary to cannabinoid use.  This time she is having lower quadrant pelvic pain.  She is having crampy pain with nausea and vomiting.  No peritoneal findings.  Reviewed CT and ultrasound with her.  Previously it was thought that she might have had hyperemesis, but she does have known cyst and oddly enough, her symptoms presented again a month later when she is having menses, leading to speculation from my side that could be pain related to the cyst which is responding to the hormone changes.  Either way, she has passed oral  challenge.  We have given her Naprosyn and Tylenol here.  Stable for discharge.  I do not think any repeat labs are indicated.  Final Clinical Impression(s) / ED Diagnoses Final diagnoses:  Pelvic pain  Cyst of ovary, unspecified laterality    Rx / DC Orders  ED Discharge Orders          Ordered    ondansetron (ZOFRAN ODT) 8 MG disintegrating tablet  Every 8 hours PRN        09/19/21 1227    dicyclomine (BENTYL) 20 MG tablet  2 times daily        09/19/21 1227             Derwood Kaplan, MD 09/19/21 1238

## 2021-09-20 ENCOUNTER — Telehealth (HOSPITAL_BASED_OUTPATIENT_CLINIC_OR_DEPARTMENT_OTHER): Payer: Self-pay | Admitting: Emergency Medicine

## 2021-09-20 ENCOUNTER — Other Ambulatory Visit: Payer: Self-pay

## 2021-09-20 ENCOUNTER — Inpatient Hospital Stay (HOSPITAL_COMMUNITY)
Admission: EM | Admit: 2021-09-20 | Discharge: 2021-09-23 | DRG: 641 | Disposition: A | Discharge status: Home / Self Care | Payer: Self-pay | Attending: Internal Medicine | Admitting: Internal Medicine

## 2021-09-20 ENCOUNTER — Encounter (HOSPITAL_COMMUNITY): Payer: Self-pay

## 2021-09-20 ENCOUNTER — Encounter (HOSPITAL_BASED_OUTPATIENT_CLINIC_OR_DEPARTMENT_OTHER): Payer: Self-pay | Admitting: Emergency Medicine

## 2021-09-20 ENCOUNTER — Emergency Department (HOSPITAL_BASED_OUTPATIENT_CLINIC_OR_DEPARTMENT_OTHER)
Admission: EM | Admit: 2021-09-20 | Discharge: 2021-09-20 | Discharge status: Against Medical Advice | Payer: BC Managed Care – PPO | Attending: Emergency Medicine | Admitting: Emergency Medicine

## 2021-09-20 DIAGNOSIS — R1031 Right lower quadrant pain: Secondary | ICD-10-CM

## 2021-09-20 DIAGNOSIS — F419 Anxiety disorder, unspecified: Secondary | ICD-10-CM | POA: Diagnosis present

## 2021-09-20 DIAGNOSIS — R112 Nausea with vomiting, unspecified: Secondary | ICD-10-CM

## 2021-09-20 DIAGNOSIS — Z20822 Contact with and (suspected) exposure to covid-19: Secondary | ICD-10-CM | POA: Diagnosis present

## 2021-09-20 DIAGNOSIS — Z681 Body mass index (BMI) 19 or less, adult: Secondary | ICD-10-CM

## 2021-09-20 DIAGNOSIS — Z7151 Drug abuse counseling and surveillance of drug abuser: Secondary | ICD-10-CM

## 2021-09-20 DIAGNOSIS — E876 Hypokalemia: Secondary | ICD-10-CM

## 2021-09-20 DIAGNOSIS — K449 Diaphragmatic hernia without obstruction or gangrene: Secondary | ICD-10-CM | POA: Diagnosis present

## 2021-09-20 DIAGNOSIS — R911 Solitary pulmonary nodule: Secondary | ICD-10-CM | POA: Diagnosis present

## 2021-09-20 DIAGNOSIS — K760 Fatty (change of) liver, not elsewhere classified: Secondary | ICD-10-CM | POA: Diagnosis present

## 2021-09-20 DIAGNOSIS — K5900 Constipation, unspecified: Secondary | ICD-10-CM | POA: Diagnosis present

## 2021-09-20 DIAGNOSIS — Z888 Allergy status to other drugs, medicaments and biological substances status: Secondary | ICD-10-CM

## 2021-09-20 DIAGNOSIS — F1210 Cannabis abuse, uncomplicated: Secondary | ICD-10-CM | POA: Diagnosis present

## 2021-09-20 DIAGNOSIS — R1084 Generalized abdominal pain: Secondary | ICD-10-CM | POA: Insufficient documentation

## 2021-09-20 DIAGNOSIS — K2210 Ulcer of esophagus without bleeding: Secondary | ICD-10-CM | POA: Diagnosis present

## 2021-09-20 DIAGNOSIS — N8311 Corpus luteum cyst of right ovary: Secondary | ICD-10-CM | POA: Diagnosis present

## 2021-09-20 DIAGNOSIS — Z79899 Other long term (current) drug therapy: Secondary | ICD-10-CM

## 2021-09-20 DIAGNOSIS — R1110 Vomiting, unspecified: Secondary | ICD-10-CM

## 2021-09-20 DIAGNOSIS — E860 Dehydration: Principal | ICD-10-CM | POA: Diagnosis present

## 2021-09-20 LAB — RESP PANEL BY RT-PCR (FLU A&B, COVID) ARPGX2
Influenza A by PCR: NEGATIVE
Influenza B by PCR: NEGATIVE
SARS Coronavirus 2 by RT PCR: NEGATIVE

## 2021-09-20 LAB — CBC
HCT: 45.4 % (ref 36.0–46.0)
Hemoglobin: 16.1 g/dL — ABNORMAL HIGH (ref 12.0–15.0)
MCH: 30 pg (ref 26.0–34.0)
MCHC: 35.5 g/dL (ref 30.0–36.0)
MCV: 84.7 fL (ref 80.0–100.0)
Platelets: 308 10*3/uL (ref 150–400)
RBC: 5.36 MIL/uL — ABNORMAL HIGH (ref 3.87–5.11)
RDW: 12.4 % (ref 11.5–15.5)
WBC: 7.8 10*3/uL (ref 4.0–10.5)
nRBC: 0 % (ref 0.0–0.2)

## 2021-09-20 LAB — RAPID URINE DRUG SCREEN, HOSP PERFORMED
Amphetamines: NOT DETECTED
Barbiturates: NOT DETECTED
Benzodiazepines: NOT DETECTED
Cocaine: NOT DETECTED
Opiates: NOT DETECTED
Tetrahydrocannabinol: POSITIVE — AB

## 2021-09-20 LAB — COMPREHENSIVE METABOLIC PANEL
ALT: 38 U/L (ref 0–44)
AST: 24 U/L (ref 15–41)
Albumin: 4.3 g/dL (ref 3.5–5.0)
Alkaline Phosphatase: 33 U/L — ABNORMAL LOW (ref 38–126)
Anion gap: 11 (ref 5–15)
BUN: 12 mg/dL (ref 6–20)
CO2: 33 mmol/L — ABNORMAL HIGH (ref 22–32)
Calcium: 9.6 mg/dL (ref 8.9–10.3)
Chloride: 91 mmol/L — ABNORMAL LOW (ref 98–111)
Creatinine, Ser: 0.69 mg/dL (ref 0.44–1.00)
GFR, Estimated: >60 mL/min (ref >60)
Glucose, Bld: 101 mg/dL — ABNORMAL HIGH (ref 70–99)
Potassium: 2.5 mmol/L — CL (ref 3.5–5.1)
Sodium: 135 mmol/L (ref 135–145)
Total Bilirubin: 0.8 mg/dL (ref 0.3–1.2)
Total Protein: 7.3 g/dL (ref 6.5–8.1)

## 2021-09-20 LAB — LIPASE, BLOOD: Lipase: 11 U/L (ref 11–51)

## 2021-09-20 LAB — POTASSIUM: Potassium: 2.7 mmol/L — CL (ref 3.5–5.1)

## 2021-09-20 LAB — MAGNESIUM: Magnesium: 2.3 mg/dL (ref 1.7–2.4)

## 2021-09-20 LAB — TSH: TSH: 0.39 u[IU]/mL (ref 0.350–4.500)

## 2021-09-20 MED ORDER — POTASSIUM CHLORIDE CRYS ER 20 MEQ PO TBCR: 60.0000 meq | EXTENDED_RELEASE_TABLET | Freq: Once | ORAL | Status: DC

## 2021-09-20 MED ORDER — DIPHENHYDRAMINE HCL 50 MG/ML IJ SOLN
25.0000 mg | Freq: Once | INTRAMUSCULAR | Status: AC
  Administered 2021-09-20: 25 mg via INTRAVENOUS
  Filled 2021-09-20: qty 1

## 2021-09-20 MED ORDER — ONDANSETRON HCL 4 MG PO TABS
4.0000 mg | ORAL_TABLET | Freq: Four times a day (QID) | ORAL | Status: DC | PRN
  Administered 2021-09-21: 4 mg via ORAL
  Filled 2021-09-20: qty 1

## 2021-09-20 MED ORDER — PANTOPRAZOLE SODIUM 40 MG IV SOLR
40.0000 mg | INTRAVENOUS | Status: DC
  Administered 2021-09-20 – 2021-09-22 (×3): 40 mg via INTRAVENOUS
  Filled 2021-09-20 (×3): qty 40

## 2021-09-20 MED ORDER — ONDANSETRON HCL 4 MG/2ML IJ SOLN: 4.0000 mg | Freq: Four times a day (QID) | INTRAMUSCULAR | Status: DC | PRN

## 2021-09-20 MED ORDER — SODIUM CHLORIDE 0.9 % IV BOLUS
1000.0000 mL | Freq: Once | INTRAVENOUS | Status: AC
  Administered 2021-09-20: 1000 mL via INTRAVENOUS

## 2021-09-20 MED ORDER — POTASSIUM CHLORIDE 10 MEQ/100ML IV SOLN
10.0000 meq | INTRAVENOUS | Status: AC
  Administered 2021-09-20 (×3): 10 meq via INTRAVENOUS
  Filled 2021-09-20 (×2): qty 100

## 2021-09-20 MED ORDER — DROPERIDOL 2.5 MG/ML IJ SOLN
1.2500 mg | Freq: Once | INTRAMUSCULAR | Status: AC
  Administered 2021-09-20: 1.25 mg via INTRAVENOUS
  Filled 2021-09-20: qty 2

## 2021-09-20 MED ORDER — ENOXAPARIN SODIUM 40 MG/0.4ML IJ SOSY
40.0000 mg | PREFILLED_SYRINGE | INTRAMUSCULAR | Status: DC
  Administered 2021-09-20 – 2021-09-22 (×3): 40 mg via SUBCUTANEOUS
  Filled 2021-09-20 (×3): qty 0.4

## 2021-09-20 MED ORDER — FENTANYL CITRATE PF 50 MCG/ML IJ SOSY
50.0000 ug | PREFILLED_SYRINGE | Freq: Once | INTRAMUSCULAR | Status: AC
Start: 2021-09-20 — End: 2021-09-20
  Administered 2021-09-20: 50 ug via INTRAVENOUS
  Filled 2021-09-20: qty 1

## 2021-09-20 MED ORDER — LACTATED RINGERS IV SOLN: INTRAVENOUS | Status: DC

## 2021-09-20 MED ORDER — POTASSIUM CHLORIDE CRYS ER 20 MEQ PO TBCR
40.0000 meq | EXTENDED_RELEASE_TABLET | Freq: Once | ORAL | Status: AC
  Administered 2021-09-20: 40 meq via ORAL
  Filled 2021-09-20: qty 2

## 2021-09-20 MED ORDER — HYDROXYZINE HCL 25 MG PO TABS: 25.0000 mg | ORAL_TABLET | Freq: Three times a day (TID) | ORAL | Status: DC

## 2021-09-20 MED ORDER — ACETAMINOPHEN 325 MG PO TABS: 650.0000 mg | ORAL_TABLET | Freq: Four times a day (QID) | ORAL | Status: DC | PRN

## 2021-09-20 MED ORDER — POTASSIUM CHLORIDE 10 MEQ/100ML IV SOLN
10.0000 meq | Freq: Once | INTRAVENOUS | Status: AC
  Administered 2021-09-20: 10 meq via INTRAVENOUS
  Filled 2021-09-20: qty 100

## 2021-09-20 MED ORDER — ACETAMINOPHEN 650 MG RE SUPP: 650.0000 mg | Freq: Four times a day (QID) | RECTAL | Status: DC | PRN

## 2021-09-20 MED ORDER — ONDANSETRON HCL 4 MG/2ML IJ SOLN
4.0000 mg | Freq: Once | INTRAMUSCULAR | Status: AC
  Administered 2021-09-20: 4 mg via INTRAVENOUS
  Filled 2021-09-20: qty 2

## 2021-09-20 MED ORDER — HYDROXYZINE HCL 25 MG PO TABS
12.5000 mg | ORAL_TABLET | Freq: Three times a day (TID) | ORAL | Status: AC
  Administered 2021-09-20 – 2021-09-21 (×3): 12.5 mg via ORAL
  Filled 2021-09-20 (×3): qty 1

## 2021-09-20 MED ORDER — TRAMADOL HCL 50 MG PO TABS: 50.0000 mg | ORAL_TABLET | Freq: Two times a day (BID) | ORAL | Status: DC | PRN

## 2021-09-20 MED ORDER — MAGNESIUM OXIDE -MG SUPPLEMENT 400 (240 MG) MG PO TABS
800.0000 mg | ORAL_TABLET | Freq: Once | ORAL | Status: AC
  Administered 2021-09-20: 800 mg via ORAL
  Filled 2021-09-20: qty 2

## 2021-09-20 NOTE — Progress Notes (Signed)
Lab just reported critical lab potassium 2.7. Provider J. Garner Nash notified, awaiting response.

## 2021-09-20 NOTE — ED Provider Notes (Signed)
[facility] EMERGENCY DEPT Provider Note   CSN: [id number] Arrival date & time: 09/20/21  0913     History Chief Complaint  Patient presents with   Abdominal Pain    Christina Mays is a 20 y.o. female.  Presenting to ER with concern for abdominal pain, nausea and vomiting.  Patient reports that after she left the ER yesterday she started having recurrence of her nausea and vomiting.  Pain is generalized, worse with vomiting, up to 10 out of 10.  Mostly dry heaving.  Pain similar to prior episodes.  Patient has had multiple recent ER visits, felt to be either hyperemesis from cannabis abuse versus corpus luteum cyst in right ovary. HPI     Past Medical History:  Diagnosis Date   Migraine with aura    Ovarian cyst     Patient Active Problem List   Diagnosis Date Noted   Migraine with aura     Past Surgical History:  Procedure Laterality Date   MOUTH SURGERY     UMBILICAL HERNIA REPAIR       OB History     Gravida  0   Para  0   Term  0   Preterm  0   AB  0   Living  0      SAB  0   IAB  0   Ectopic  0   Multiple  0   Live Births  0           History reviewed. No pertinent family history.  Social History   Tobacco Use   Smoking status: Never   Smokeless tobacco: Never  Vaping Use   Vaping Use: Never used  Substance Use Topics   Alcohol use: No   Drug use: Yes    Types: Marijuana    Home Medications Prior to Admission medications   Medication Sig Start Date End Date Taking? Authorizing Provider  dicyclomine (BENTYL) 10 MG capsule Take 1 capsule (10 mg total) by mouth 3 (three) times daily before meals. Patient not taking: No sig reported 10/23/19   Cirigliano, Corrie Dandy K, DO  dicyclomine (BENTYL) 20 MG tablet Take 1 tablet (20 mg total) by mouth 2 (two) times daily. 09/19/21   Derwood Kaplan, MD  famotidine (PEPCID) 20 MG tablet TAKE 1 TABLET BY MOUTH TWICE A DAY 12/09/19   Cirigliano, Mary K, DO  HYDROcodone-acetaminophen  (NORCO/VICODIN) 5-325 MG tablet Take 1 tablet by mouth every 6 (six) hours as needed for moderate pain. 08/18/21   Vanetta Mulders, MD  metoCLOPramide (REGLAN) 10 MG tablet Take 1 tablet (10 mg total) by mouth every 6 (six) hours as needed for nausea or vomiting. 09/17/21   Gwyneth Sprout, MD  ondansetron (ZOFRAN ODT) 8 MG disintegrating tablet Take 1 tablet (8 mg total) by mouth every 8 (eight) hours as needed for nausea. 09/19/21   Derwood Kaplan, MD  potassium chloride SA (KLOR-CON) 20 MEQ tablet Take 1 tablet (20 mEq total) by mouth daily for 3 doses. 08/21/21 08/24/21  Sponseller, Eugene Gavia, PA-C  promethazine (PHENERGAN) 12.5 MG suppository Place 1 suppository (12.5 mg total) rectally every 6 (six) hours as needed for nausea or vomiting. 08/20/21   Romualdo Bolk, MD    Allergies    Pepto-bismol [bismuth subsalicylate]  Review of Systems   Review of Systems  Constitutional:  Negative for chills and fever.  HENT:  Negative for ear pain and sore throat.   Eyes:  Negative for pain and visual disturbance.  Respiratory:  Negative for cough and shortness of breath.   Cardiovascular:  Negative for chest pain and palpitations.  Gastrointestinal:  Positive for abdominal pain, nausea and vomiting.  Genitourinary:  Negative for dysuria and hematuria.  Musculoskeletal:  Negative for arthralgias and back pain.  Skin:  Negative for color change and rash.  Neurological:  Negative for seizures and syncope.  All other systems reviewed and are negative.  Physical Exam Updated Vital Signs BP 117/81   Pulse 83   Temp 98.3 F (36.8 C) (Oral)   Resp 16   LMP 09/14/2021   SpO2 100%   Physical Exam Vitals and nursing note reviewed.  Constitutional:      General: She is not in acute distress.    Appearance: She is well-developed.  HENT:     Head: Normocephalic and atraumatic.  Eyes:     Conjunctiva/sclera: Conjunctivae normal.  Cardiovascular:     Rate and Rhythm: Normal rate and  regular rhythm.     Heart sounds: No murmur heard. Pulmonary:     Effort: Pulmonary effort is normal. No respiratory distress.     Breath sounds: Normal breath sounds.  Abdominal:     Palpations: Abdomen is soft.     Tenderness: There is no abdominal tenderness.  Genitourinary:    Vagina: No vaginal discharge or erythema.  Musculoskeletal:     Cervical back: Neck supple.  Skin:    General: Skin is warm and dry.  Neurological:     General: No focal deficit present.     Mental Status: She is alert.  Psychiatric:        Mood and Affect: Mood normal.    ED Results / Procedures / Treatments   Labs (all labs ordered are listed, but only abnormal results are displayed) Labs Reviewed  CBC - Abnormal; Notable for the following components:      Result Value   RBC 5.36 (*)    Hemoglobin 16.1 (*)    All other components within normal limits  URINALYSIS, ROUTINE W REFLEX MICROSCOPIC  PREGNANCY, URINE  COMPREHENSIVE METABOLIC PANEL  LIPASE, BLOOD    EKG None  Radiology No results found.  Procedures Procedures   Medications Ordered in ED Medications  ondansetron (ZOFRAN) injection 4 mg (4 mg Intravenous Given 09/20/21 0956)  sodium chloride 0.9 % bolus 1,000 mL (0 mLs Intravenous Stopped 09/20/21 1117)  fentaNYL (SUBLIMAZE) injection 50 mcg (50 mcg Intravenous Given 09/20/21 0957)    ED Course  I have reviewed the triage vital signs and the nursing notes.  Pertinent labs & imaging results that were available during my care of the patient were reviewed by me and considered in my medical decision making (see chart for details).    MDM Rules/Calculators/A&P                         20 year old presents to ER for abdominal pain nausea and vomiting.  Multiple prior ER visits with similar symptoms felt to be either from corpus luteum cyst versus cannabis hyperemesis.  Given symptomatology, will check basic labs and give antiemetics, pain medicine, fluids and further  observe.  Prior to my reevaluation and discussing laboratory results and further plan, patient left before treatment was complete.   Final Clinical Impression(s) / ED Diagnoses Final diagnoses:  Hyperemesis  Generalized abdominal pain    Rx / DC Orders ED Discharge Orders     None        Milagros Loll,  MD 09/20/21 1126

## 2021-09-20 NOTE — ED Provider Notes (Signed)
[facility]-EMERGENCY DEPT Provider Note   CSN: [id number] Arrival date & time: 09/20/21  1144     History Chief Complaint  Patient presents with   Abnormal Lab   Abdominal Pain   Emesis    Christina Mays is a 20 y.o. female.  20 yo F with chief complaints of nausea and vomiting.  Patient has had this off and on and has had multiple visits to the ED for the same.  Was actually seen earlier today at one of our other hospitals and had blood work drawn and she left because she did not like the way that she was being treated by the nursing staff.  She has had some persistent nausea since leaving.  Continued abdominal discomfort.  The history is provided by the patient.  Abnormal Lab Abdominal Pain Associated symptoms: nausea and vomiting   Associated symptoms: no chest pain, no chills, no dysuria, no fever and no shortness of breath   Emesis Associated symptoms: abdominal pain   Associated symptoms: no arthralgias, no chills, no fever, no headaches and no myalgias   Illness Severity:  Moderate Onset quality:  Gradual Duration:  2 days Timing:  Constant Progression:  Worsening Chronicity:  Recurrent Associated symptoms: abdominal pain, nausea and vomiting   Associated symptoms: no chest pain, no congestion, no fever, no headaches, no myalgias, no rhinorrhea, no shortness of breath and no wheezing       Past Medical History:  Diagnosis Date   Migraine with aura    Ovarian cyst     Patient Active Problem List   Diagnosis Date Noted   Migraine with aura     Past Surgical History:  Procedure Laterality Date   MOUTH SURGERY     UMBILICAL HERNIA REPAIR       OB History     Gravida  0   Para  0   Term  0   Preterm  0   AB  0   Living  0      SAB  0   IAB  0   Ectopic  0   Multiple  0   Live Births  0           Family History  Family history unknown: Yes    Social History   Tobacco Use   Smoking status: Never    Smokeless tobacco: Never  Vaping Use   Vaping Use: Never used  Substance Use Topics   Alcohol use: No   Drug use: Yes    Types: Marijuana    Home Medications Prior to Admission medications   Medication Sig Start Date End Date Taking? Authorizing Provider  dicyclomine (BENTYL) 10 MG capsule Take 1 capsule (10 mg total) by mouth 3 (three) times daily before meals. Patient not taking: No sig reported 10/23/19   Cirigliano, Corrie Dandy K, DO  dicyclomine (BENTYL) 20 MG tablet Take 1 tablet (20 mg total) by mouth 2 (two) times daily. 09/19/21   Derwood Kaplan, MD  famotidine (PEPCID) 20 MG tablet TAKE 1 TABLET BY MOUTH TWICE A DAY 12/09/19   Cirigliano, Mary K, DO  HYDROcodone-acetaminophen (NORCO/VICODIN) 5-325 MG tablet Take 1 tablet by mouth every 6 (six) hours as needed for moderate pain. 08/18/21   Vanetta Mulders, MD  metoCLOPramide (REGLAN) 10 MG tablet Take 1 tablet (10 mg total) by mouth every 6 (six) hours as needed for nausea or vomiting. 09/17/21   Gwyneth Sprout, MD  ondansetron (ZOFRAN ODT) 8 MG disintegrating tablet Take  1 tablet (8 mg total) by mouth every 8 (eight) hours as needed for nausea. 09/19/21   Derwood Kaplan, MD  potassium chloride SA (KLOR-CON) 20 MEQ tablet Take 1 tablet (20 mEq total) by mouth daily for 3 doses. 08/21/21 08/24/21  Sponseller, Eugene Gavia, PA-C  promethazine (PHENERGAN) 12.5 MG suppository Place 1 suppository (12.5 mg total) rectally every 6 (six) hours as needed for nausea or vomiting. 08/20/21   Romualdo Bolk, MD    Allergies    Pepto-bismol [bismuth subsalicylate]  Review of Systems   Review of Systems  Constitutional:  Negative for chills and fever.  HENT:  Negative for congestion and rhinorrhea.   Eyes:  Negative for redness and visual disturbance.  Respiratory:  Negative for shortness of breath and wheezing.   Cardiovascular:  Negative for chest pain and palpitations.  Gastrointestinal:  Positive for abdominal pain, nausea and vomiting.   Genitourinary:  Negative for dysuria and urgency.  Musculoskeletal:  Negative for arthralgias and myalgias.  Skin:  Negative for pallor and wound.  Neurological:  Negative for dizziness and headaches.   Physical Exam Updated Vital Signs BP (!) 141/92   Pulse 70   Temp 98.5 F (36.9 C) (Oral)   Resp 15   Ht 5' (1.524 m)   Wt 45.6 kg   LMP 09/20/2021   SpO2 100%   BMI 19.62 kg/m   Physical Exam Vitals and nursing note reviewed.  Constitutional:      General: She is not in acute distress.    Appearance: She is well-developed. She is not diaphoretic.  HENT:     Head: Normocephalic and atraumatic.  Eyes:     Pupils: Pupils are equal, round, and reactive to light.  Cardiovascular:     Rate and Rhythm: Normal rate and regular rhythm.     Heart sounds: No murmur heard.   No friction rub. No gallop.  Pulmonary:     Effort: Pulmonary effort is normal.     Breath sounds: No wheezing or rales.  Abdominal:     General: There is no distension.     Palpations: Abdomen is soft.     Tenderness: There is no abdominal tenderness.  Musculoskeletal:        General: No tenderness.     Cervical back: Normal range of motion and neck supple.  Skin:    General: Skin is warm and dry.  Neurological:     Mental Status: She is alert and oriented to person, place, and time.  Psychiatric:        Behavior: Behavior normal.    ED Results / Procedures / Treatments   Labs (all labs ordered are listed, but only abnormal results are displayed) Labs Reviewed  RESP PANEL BY RT-PCR (FLU A&B, COVID) [id number]  MAGNESIUM    EKG EKG Interpretation  Date/Time:  Monday September 20 2021 13:46:21 EDT Ventricular Rate:  72 PR Interval:  132 QRS Duration: 70 QT Interval:  393 QTC Calculation: 431 R Axis:   78 Text Interpretation: Sinus rhythm Abnormal T, consider ischemia, anterior leads U waves present Otherwise no significant change Confirmed by Melene Plan [id number]) on 09/20/2021 1:55:23  PM  Radiology No results found.  Procedures Procedures   Medications Ordered in ED Medications  potassium chloride 10 mEq in 100 mL IVPB (10 mEq Intravenous New Bag/Given 09/20/21 1329)  potassium chloride 10 mEq in 100 mL IVPB (has no administration in time range)  sodium chloride 0.9 % bolus 1,000 mL (1,000 mLs Intravenous New  Bag/Given 09/20/21 1329)  droperidol (INAPSINE) 2.5 MG/ML injection 1.25 mg (1.25 mg Intravenous Given 09/20/21 1330)  potassium chloride SA (KLOR-CON) CR tablet 40 mEq (40 mEq Oral Given 09/20/21 1331)  magnesium oxide (MAG-OX) tablet 800 mg (800 mg Oral Given 09/20/21 1331)  diphenhydrAMINE (BENADRYL) injection 25 mg (25 mg Intravenous Given 09/20/21 1331)    ED Course  I have reviewed the triage vital signs and the nursing notes.  Pertinent labs & imaging results that were available during my care of the patient were reviewed by me and considered in my medical decision making (see chart for details).    MDM Rules/Calculators/A&P                           20 yo F with a chief complaints of persistent nausea and vomiting.  Patient has had ongoing issues with this.  Has been seen multiple times in the ED for this issue.  Was actually seen in ED this morning and left because she did not like the way she was being treated.  Patient without obvious signs of dehydration clinically but her blood work earlier is consistent with dehydration we will give a bolus of IV fluids.  Also of note the potassium was 2.5 there.  Check a mag level will try and replenish potassium and magnesium.  Reassess.  Patient has U waves on her EKG and prolonged QT.  Will discuss with medicine for admission.  CRITICAL CARE Performed by: Rae Roam   Total critical care time: 35 minutes  Critical care time was exclusive of separately billable procedures and treating other patients.  Critical care was necessary to treat or prevent imminent or life-threatening  deterioration.  Critical care was time spent personally by me on the following activities: development of treatment plan with patient and/or surrogate as well as nursing, discussions with consultants, evaluation of patient's response to treatment, examination of patient, obtaining history from patient or surrogate, ordering and performing treatments and interventions, ordering and review of laboratory studies, ordering and review of radiographic studies, pulse oximetry and re-evaluation of patient's condition.   The patients results and plan were reviewed and discussed.   Any x-rays performed were independently reviewed by myself.   Differential diagnosis were considered with the presenting HPI.  Medications  potassium chloride 10 mEq in 100 mL IVPB (10 mEq Intravenous New Bag/Given 09/20/21 1329)  potassium chloride 10 mEq in 100 mL IVPB (has no administration in time range)  sodium chloride 0.9 % bolus 1,000 mL (1,000 mLs Intravenous New Bag/Given 09/20/21 1329)  droperidol (INAPSINE) 2.5 MG/ML injection 1.25 mg (1.25 mg Intravenous Given 09/20/21 1330)  potassium chloride SA (KLOR-CON) CR tablet 40 mEq (40 mEq Oral Given 09/20/21 1331)  magnesium oxide (MAG-OX) tablet 800 mg (800 mg Oral Given 09/20/21 1331)  diphenhydrAMINE (BENADRYL) injection 25 mg (25 mg Intravenous Given 09/20/21 1331)    Vitals:   09/20/21 1156 09/20/21 1208 09/20/21 1353  BP: (!) 133/91  (!) 141/92  Pulse: (!) 110  70  Resp: 16  15  Temp: 98.5 F (36.9 C)    TempSrc: Oral    SpO2: 100%  100%  Weight:  45.6 kg   Height:  5' (1.524 m)     Final diagnoses:  Nausea and vomiting in adult  Hypokalemia    Admission/ observation were discussed with the admitting physician, patient and/or family and they are comfortable with the plan.   Final Clinical Impression(s) /  ED Diagnoses Final diagnoses:  Nausea and vomiting in adult  Hypokalemia    Rx / DC Orders ED Discharge Orders     None         Melene Plan, DO 09/20/21 1357

## 2021-09-20 NOTE — ED Notes (Signed)
Pts sister at nurses station yelling about her sister not receiving any care and staff being rude. I introduced myself as pt's nurse, asked her to give me a few minutes to finish giving report on other patients and I would come to bedside to discuss pt care in the presence of the pt.   This RN requested security and charge RN, Wyn Forster to be present at bedside.   Immediately upon entry to room my throat began to burn and itch due to heavy marijuana scent. Same as when I entered the room after her significant other arrived. This RN requested the door to be ajar for our conversation. Sister then begins yelling stating "she has been here three times for the same and we're just going to let her die". Pt told sister we had "not medicated her, given her anything to drink and had been rude to her girlfriend".  The marijuana scent was so strong causing SOB and throat discomfort for this RN.  Per pt verbal approval this RN then attempted to explain the care that had been given including medication administration. Sister stood up demanding we remove pt's IV, states, "we're not leaving, we're going somewhere else".  This RN requested security to escort sister out so pt could be safely discharged as sister was yelling and becoming hostile with this RN

## 2021-09-20 NOTE — H&P (Signed)
History and Physical    Christina Mays XNA:[id number] DOB: 11/07/2001 DOA: 09/20/2021  PCP: Pcp, No Consultants:   Patient coming from:  Home - lives with  Chief Complaint: Nausea vomiting and abdominal pain  HPI: Christina Mays is a 20 y.o. female with medical history significant of migraines who presented to ED for second time today for Nausea, vomiting and abdominal pain that started on Tuesday this week. She has had about 10 episodes of vomiting/day. She has zofran, phenergan and reglan, but states these do not help her. She states she has had issues with vomiting x 4 years. She smokes MJ nearly daily. She states she had drank some alcohol on Tuesday and she thinks that is what set off her N/V and stomach pain. Her stomach pain is located in her RLQ and is the same that she has had in the past. CT scan done in 08/21/21. Pain now a zero, but when she has it it's a 10/10. Pain is stabbing in nature. Hot water seems to make it better. She denies any diarrhea. She is very anxious and scared. Has been happening at time of her period.   LMP: on her period now.    She has had multiple ED visits for nausea vomiting and abdominal pain thought to be secondary to cannabis hyperemesis or corpus luteum cyst. Has visited ED 9/12, 913. 914, 9/17, 10/14, 10/16 and twice today.   She denies any fever/chills, headaches, vision changes, chest pain, palpitations, shortness of breath, cough, vaginal discharge, dysuria, diarrhea, leg swelling or rashes. She admits to using MJ nearly daily. Denies drinking alcohol daily or any other drugs.   ED Course: vitals: Afebrile, blood pressure 133/91, heart rate 110, respiratory rate 16, oxygen 100% on room air. Pertinent labs: Potassium 2.5, chloride 91, CO2 33, magnesium pending In ED patient given Benadryl, droperidol,  Mag-Ox tablet, 30 mEq potassium IV, 40 mEq potassium oral, and a 1 L bolus. [facility] was asked to admit.   Review of Systems: As per HPI; otherwise review of  systems reviewed and negative.   Ambulatory Status:  Ambulates without assistance  Past Medical History:  Diagnosis Date   Migraine with aura    Ovarian cyst     Past Surgical History:  Procedure Laterality Date   MOUTH SURGERY     UMBILICAL HERNIA REPAIR      Social History   Socioeconomic History   Marital status: Single    Spouse name: Not on file   Number of children: Not on file   Years of education: Not on file   Highest education level: Not on file  Occupational History   Not on file  Tobacco Use   Smoking status: Never   Smokeless tobacco: Never  Vaping Use   Vaping Use: Never used  Substance and Sexual Activity   Alcohol use: No   Drug use: Yes    Types: Marijuana   Sexual activity: Yes    Birth control/protection: Condom  Other Topics Concern   Not on file  Social History Narrative   Not on file   Social Determinants of Health   Financial Resource Strain: Not on file  Food Insecurity: Not on file  Transportation Needs: Not on file  Physical Activity: Not on file  Stress: Not on file  Social Connections: Not on file  Intimate Partner Violence: Not on file    Allergies  Allergen Reactions   Pepto-Bismol [Bismuth Subsalicylate] Nausea And Vomiting    Family History  Family history  unknown: Yes    Prior to Admission medications   Medication Sig Start Date End Date Taking? Authorizing Provider  dicyclomine (BENTYL) 10 MG capsule Take 1 capsule (10 mg total) by mouth 3 (three) times daily before meals. Patient not taking: No sig reported 10/23/19   Cirigliano, Corrie Dandy K, DO  dicyclomine (BENTYL) 20 MG tablet Take 1 tablet (20 mg total) by mouth 2 (two) times daily. 09/19/21   Derwood Kaplan, MD  famotidine (PEPCID) 20 MG tablet TAKE 1 TABLET BY MOUTH TWICE A DAY 12/09/19   Cirigliano, Mary K, DO  HYDROcodone-acetaminophen (NORCO/VICODIN) 5-325 MG tablet Take 1 tablet by mouth every 6 (six) hours as needed for moderate pain. 08/18/21   Vanetta Mulders, MD  metoCLOPramide (REGLAN) 10 MG tablet Take 1 tablet (10 mg total) by mouth every 6 (six) hours as needed for nausea or vomiting. 09/17/21   Gwyneth Sprout, MD  ondansetron (ZOFRAN ODT) 8 MG disintegrating tablet Take 1 tablet (8 mg total) by mouth every 8 (eight) hours as needed for nausea. 09/19/21   Derwood Kaplan, MD  potassium chloride SA (KLOR-CON) 20 MEQ tablet Take 1 tablet (20 mEq total) by mouth daily for 3 doses. 08/21/21 08/24/21  Sponseller, Eugene Gavia, PA-C  promethazine (PHENERGAN) 12.5 MG suppository Place 1 suppository (12.5 mg total) rectally every 6 (six) hours as needed for nausea or vomiting. 08/20/21   Romualdo Bolk, MD    Physical Exam: Vitals:   09/20/21 1156 09/20/21 1208 09/20/21 1353  BP: (!) 133/91  (!) 141/92  Pulse: (!) 110  70  Resp: 16  15  Temp: 98.5 F (36.9 C)    TempSrc: Oral    SpO2: 100%  100%  Weight:  45.6 kg   Height:  5' (1.524 m)      General:  Appears calm and comfortable and is in NAD. Visibly shaking from anxiety. Thin.  Eyes:  PERRL, EOMI, normal lids, iris ENT:  grossly normal hearing, lips & tongue, mmm; appropriate dentition Neck:  no LAD, masses or thyromegaly; no carotid bruits Cardiovascular:  RRR, no m/r/g. No LE edema.  Respiratory:   CTA bilaterally with no wheezes/rales/rhonchi.  Normal respiratory effort. Abdomen:  soft, TTP in epigastric area, LLQ and RLQ. No rebound or guarding.  ND, NABS Back:   normal alignment, no CVAT Skin:  no rash or induration seen on limited exam Musculoskeletal:  grossly normal tone BUE/BLE, good ROM, no bony abnormality Lower extremity:  No LE edema.  Limited foot exam with no ulcerations.  2+ distal pulses. Psychiatric:  grossly normal mood and affect, speech fluent and appropriate, AOx3 Neurologic:  CN 2-12 grossly intact, moves all extremities in coordinated fashion, sensation intact    Radiological Exams on Admission: Independently reviewed - see discussion in A/P where  applicable  No results found.  EKG: Independently reviewed.  NSR with rate 72, concern for u waves; nonspecific ST changes with no evidence of acute ischemia   Labs on Admission: I have personally reviewed the available labs and imaging studies at the time of the admission.  Pertinent labs:  Potassium 2.5,  chloride 91,  CO2 33   Assessment/Plan Principal Problem:   Intractable nausea and vomiting  -cyclic, chronic issue.  -likely secondary to cannabis use-UDS pending  -Continue IV fluid -IV PPI -Antiemetics as needed. She has had no episodes of vomiting today. Will start with zofran and escalate as needed.  -Urged cessation of marijuana use -would consider outpatient GI f/u.   Active Problems:  Hypokalemia -Secondary to intractable nausea and vomiting  -Possible U waves on EKG. admit to telemetry -Magnesium pending -Has been repleted in ED with total of 70 mEq.  will repeat potassium later on today.  Continue to replete as needed -Antiemetics  Abdominal pain -non surgical exam. Rates pain 0/10 during admit.  Chronic in nature, no change from previous pain with normal scan in 08/2021.  -pain meds prn, no indication to scan at this point, but follow clinically  -seems to be related to her cycle as the past 3 times now have been when she is on her period, would advise to f/u with her gyn.  -pelvic US on 08/2021 shows corpus luteum or hemorrhagic cyst.   Anxiety Very nervous about coming into hospital.  Hydroxyzine scheduled x 24 hours then prn, may help with nausea as well.    Body mass index is 19.62 kg/m.    Level of care:  DVT prophylaxis:  Lovenox  Code Status:  Full - confirmed with patient Family Communication: her fiance is present: Barrister's clerk  Disposition Plan:  The patient is from: home  Anticipated d/c is to: home  Requires inpatient hospitalization and is at significant risk of worsening, requires constant monitoring, IVF and IV medication, and  frequent assessment Patient is currently: stable, but ill  Consults called: none  Admission status:  observation   Dragon dictation used in completing this note.   Orland Mustard MD [facility]   How to contact the [facility] Attending or Consulting provider 7A - 7P or covering provider during after hours 7P -7A, for this patient?  Check the care team in [facility] and look for a) attending/consulting [facility] provider listed and b) the [facility] team listed Log into [email or web address] and use [place]'s universal password to access. If you do not have the password, please contact the hospital operator. Locate the [facility] provider you are looking for under [facility] and page to a number that you can be directly reached. If you still have difficulty reaching the provider, please page the [facility] (Director on Call) for the Hospitalists listed on amion for assistance.   09/20/2021, 2:15 PM

## 2021-09-20 NOTE — ED Notes (Signed)
Pt requesting something to eat and drink. Explained we will most definitely do an oral challenge but wanted to give nausea meds a chance to work first

## 2021-09-20 NOTE — ED Provider Notes (Signed)
Emergency Medicine Provider Triage Evaluation Note  Christina Mays , a 20 y.o. female  was evaluated in triage.  Pt complains of low potassium. Was at [facility] earlier, level 2.5. Has been sick 2+ weeks    Review of Systems  Positive: NV Negative: Cp, sob  Physical Exam  BP (!) 133/91 (BP Location: Right Arm)   Pulse (!) 110   Temp 98.5 F (36.9 C) (Oral)   Resp 16   Ht 5' (1.524 m)   Wt 45.6 kg   LMP 09/20/2021   SpO2 100%   BMI 19.62 kg/m  Gen:   Awake, no distress   Resp:  Normal effort  MSK:   Moves extremities without difficulty  Other:    Medical Decision Making  Medically screening exam initiated at 12:10 PM.  Appropriate orders placed.  Christina Mays was informed that the remainder of the evaluation will be completed by another provider, this initial triage assessment does not replace that evaluation, and the importance of remaining in the ED until their evaluation is complete.  Does not want PO pill, reports she almost always requires and IV because pills do not work very well. Also nauseous    Saddie Benders, PA-C 09/20/21 1211    Virgina Norfolk, DO 09/20/21 1550

## 2021-09-20 NOTE — ED Notes (Signed)
Family at bedside asking multiple questions at one time, immediately as this RN walked into room family stated, "what, you come in here and don't speak to anyone". She then requested this RN stop trying to collect blood, states, "I don't want you hurting her". Family came in with a strong scent of mariajuana on clothing.

## 2021-09-20 NOTE — ED Triage Notes (Signed)
Pt arrives to ED with c/o lower abdominal pain for two weeks. The pain worsened when she started her period on 10/11. She reports she had had vomiting multiple times daily and unable to eat or drink.

## 2021-09-20 NOTE — ED Triage Notes (Addendum)
Patient reports that she was notified by a physician and told that her K+ level was low.  Patient was at [facility] earlier and states she received nausea medicine and then patient left AMA because she "did not like her care."  Patient reports that she has been vomiting x 1 month and having RLQ pain due to an ovarian cyst

## 2021-09-20 NOTE — ED Notes (Signed)
Ambulatory to bathroom. Gait steady.

## 2021-09-20 NOTE — ED Notes (Signed)
Patient anxious. States "I can not go upstairs and be admitted. My anxiety won't let me. I have to go home". S.O at bedside encouraging patient to stay

## 2021-09-21 ENCOUNTER — Observation Stay (HOSPITAL_COMMUNITY): Payer: Self-pay

## 2021-09-21 DIAGNOSIS — E876 Hypokalemia: Secondary | ICD-10-CM

## 2021-09-21 DIAGNOSIS — R1031 Right lower quadrant pain: Secondary | ICD-10-CM

## 2021-09-21 LAB — CBC
HCT: 38.5 % (ref 36.0–46.0)
Hemoglobin: 12.9 g/dL (ref 12.0–15.0)
MCH: 30.1 pg (ref 26.0–34.0)
MCHC: 33.5 g/dL (ref 30.0–36.0)
MCV: 89.7 fL (ref 80.0–100.0)
Platelets: 239 10*3/uL (ref 150–400)
RBC: 4.29 MIL/uL (ref 3.87–5.11)
RDW: 12.9 % (ref 11.5–15.5)
WBC: 8 10*3/uL (ref 4.0–10.5)
nRBC: 0 % (ref 0.0–0.2)

## 2021-09-21 LAB — BASIC METABOLIC PANEL
Anion gap: 6 (ref 5–15)
BUN: 10 mg/dL (ref 6–20)
CO2: 29 mmol/L (ref 22–32)
Calcium: 9.1 mg/dL (ref 8.9–10.3)
Chloride: 102 mmol/L (ref 98–111)
Creatinine, Ser: 0.77 mg/dL (ref 0.44–1.00)
GFR, Estimated: >60 mL/min (ref >60)
Glucose, Bld: 96 mg/dL (ref 70–99)
Potassium: 3.3 mmol/L — ABNORMAL LOW (ref 3.5–5.1)
Sodium: 137 mmol/L (ref 135–145)

## 2021-09-21 LAB — HIV ANTIBODY (ROUTINE TESTING W REFLEX): HIV Screen 4th Generation wRfx: NONREACTIVE

## 2021-09-21 MED ORDER — POTASSIUM CHLORIDE CRYS ER 20 MEQ PO TBCR
40.0000 meq | EXTENDED_RELEASE_TABLET | Freq: Once | ORAL | Status: AC
  Administered 2021-09-21: 40 meq via ORAL
  Filled 2021-09-21: qty 2

## 2021-09-21 MED ORDER — PROCHLORPERAZINE EDISYLATE 10 MG/2ML IJ SOLN: 10.0000 mg | Freq: Once | INTRAMUSCULAR | Status: DC

## 2021-09-21 MED ORDER — MORPHINE SULFATE (PF) 2 MG/ML IV SOLN
1.0000 mg | INTRAVENOUS | Status: DC | PRN
  Administered 2021-09-21 (×2): 1 mg via INTRAVENOUS
  Filled 2021-09-21 (×2): qty 1

## 2021-09-21 MED ORDER — POTASSIUM CHLORIDE CRYS ER 20 MEQ PO TBCR
40.0000 meq | EXTENDED_RELEASE_TABLET | ORAL | Status: DC
  Administered 2021-09-21: 40 meq via ORAL
  Filled 2021-09-21: qty 2

## 2021-09-21 MED ORDER — LORAZEPAM 2 MG/ML IJ SOLN: 0.5000 mg | Freq: Once | INTRAMUSCULAR | Status: DC

## 2021-09-21 MED ORDER — SODIUM CHLORIDE 0.9 % IV SOLN
12.5000 mg | Freq: Four times a day (QID) | INTRAVENOUS | Status: DC | PRN
  Administered 2021-09-21: 12.5 mg via INTRAVENOUS
  Filled 2021-09-21: qty 12.5

## 2021-09-21 MED ORDER — POTASSIUM CHLORIDE IN NACL 20-0.45 MEQ/L-% IV SOLN
INTRAVENOUS | Status: AC
  Filled 2021-09-21 (×3): qty 1000

## 2021-09-21 MED ORDER — ONDANSETRON HCL 4 MG/2ML IJ SOLN
4.0000 mg | Freq: Three times a day (TID) | INTRAMUSCULAR | Status: DC
  Administered 2021-09-21 – 2021-09-23 (×5): 4 mg via INTRAVENOUS
  Filled 2021-09-21 (×5): qty 2
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
## 2021-09-21 NOTE — Progress Notes (Addendum)
[facility] PROGRESS NOTE   Christina Mays NKN:[id number] DOB: 2001-12-04 DOA: 09/20/2021  PCP: Overton Mam, DO  Brief History/Interval Summary: 20 y.o. female with medical history significant of migraines who presented to ED for second time for Nausea, vomiting and abdominal pain that started on Tuesday this week. She has had about 10 episodes of vomiting/day. She has zofran, phenergan and reglan, but states these do not help her. She states she has had issues with vomiting x 4 years. She smokes marijuana nearly daily. She states she had drank some alcohol on Tuesday and she thinks that is what set off her N/V and stomach pain. Her stomach pain is located in her RLQ and is the same that she has had in the past. CT scan done in 08/18/21 did not show any acute findings. She has had multiple ED visits for nausea vomiting and abdominal pain thought to be secondary to cannabis hyperemesis or corpus luteum cyst. Has visited ED 9/12, 913. 914, 9/17, 10/14, 10/16 and twice on 10/17.    Reason for Visit: Cannabis hyperemesis  Consultants: None  Procedures: None    Subjective/Interval History: Patient was feeling well this morning when I rounded on her.  She had not vomited all night.  Wanted to go home.  Plan was for discharge however patient started vomiting after having breakfast so discharge to be canceled.    Assessment/Plan:  Intractable nausea and vomiting Most likely secondary to cannabis use.  Urine drug screen was positive for same.  Patient smokes at least 3 times a week and also has edible formulations.  Recent CT scan done in September did not show any acute findings.  Will do abdominal film.  We will treat her symptomatically.  IV fluids.  Clear liquid diet for now.  May need outpatient evaluation by gastroenterology for her longstanding symptoms.  ADDENDUM Discussed with patient's mother. Apparently symptoms have been ongoing on and off for several months. Patient has  apparently lost a lot of weight as well. In view of this patient may benefit from inpatient GI input.  Contacted [facility].  Hypokalemia Improved.  Additional supplements ordered today.  Magnesium was 2.3 yesterday.  Abdominal pain Examination was benign this morning.  Symptoms most likely due to the fact that she has been vomiting.  Lipase was normal.  LFTs were normal.  PPI being given.  History of ovarian cyst Follows with her GYN on a regular basis.  Marijuana abuse Likely contributing to her symptoms.  She was counseled to lose stop using marijuana also heavily.  DVT Prophylaxis: Lovenox Code Status: Full code Family Communication: Discussed with the patient.  Will attempt to reach her mother Disposition Plan: Hopefully return home when improved  Status is: Observation  The patient will require care spanning > 2 midnights and should be moved to inpatient because: Continues to have intractable nausea and vomiting which would require another night stay in the hospital with IV fluids.     Medications: Scheduled:  enoxaparin (LOVENOX) injection  40 mg Subcutaneous Q24H   pantoprazole (PROTONIX) IV  40 mg Intravenous Q24H   Continuous:  0.45 % NaCl with KCl 20 mEq / L 100 mL/hr at 09/21/21 1309   promethazine (PHENERGAN) injection (IM or IVPB) 12.5 mg (09/21/21 1249)   FXT:[id number] **OR** acetaminophen, morphine injection, ondansetron **OR** ondansetron (ZOFRAN) IV, promethazine (PHENERGAN) injection (IM or IVPB)  Antibiotics: Anti-infectives (From admission, onward)    None       Objective:  Vital Signs  Vitals:   09/20/21 1749 09/20/21 2020 09/21/21 0005 09/21/21 0423  BP: 131/78 131/78 128/82 137/82  Pulse: 72 70 65 77  Resp: 16 20 20 20   Temp: 97.8 F (36.6 C) 98.3 F (36.8 C) 97.8 F (36.6 C) 98.4 F (36.9 C)  TempSrc: Oral Oral Oral Oral  SpO2: 97% 100% 100% 99%  Weight:      Height:        Intake/Output Summary (Last 24 hours) at 09/21/2021  1345 Last data filed at 09/21/2021 0700 Gross per 24 hour  Intake 1619.51 ml  Output --  Net 1619.51 ml   Filed Weights   09/20/21 1208  Weight: 45.6 kg    General appearance: Awake alert.  In no distress Resp: Clear to auscultation bilaterally.  Normal effort Cardio: S1-S2 is normal regular.  No S3-S4.  No rubs murmurs or bruit GI: Abdomen is soft.  Mildly tender in the epigastric area without any rebound rigidity or guarding.  No masses organomegaly.   Extremities: No edema.  Full range of motion of lower extremities. Neurologic: Alert and oriented x3.  No focal neurological deficits.    Lab Results:  Data Reviewed: I have personally reviewed following labs and imaging studies  CBC: Recent Labs  Lab 09/17/21 1353 09/20/21 0934 09/21/21 0436  WBC 10.1 7.8 8.0  HGB 14.4 16.1* 12.9  HCT 41.0 45.4 38.5  MCV 85.4 84.7 89.7  PLT 282 308 239    Basic Metabolic Panel: Recent Labs  Lab 09/17/21 1353 09/20/21 1025 09/20/21 1322 09/20/21 1759 09/21/21 0436  NA 137 135  --   --  137  K 2.5* 2.5*  --  2.7* 3.3*  CL 94* 91*  --   --  102  CO2 29 33*  --   --  29  GLUCOSE 130* 101*  --   --  96  BUN 9 12  --   --  10  CREATININE 0.59 0.69  --   --  0.77  CALCIUM 10.7* 9.6  --   --  9.1  MG  --   --  2.3  --   --     GFR: Estimated Creatinine Clearance: 79.9 mL/min (by C-G formula based on SCr of 0.77 mg/dL).  Liver Function Tests: Recent Labs  Lab 09/17/21 1353 09/20/21 1025  AST 24 24  ALT 36 38  ALKPHOS 38 33*  BILITOT 0.7 0.8  PROT 8.5* 7.3  ALBUMIN 5.0 4.3    Recent Labs  Lab 09/17/21 1353 09/20/21 1025  LIPASE <10* 11     Thyroid Function Tests: Recent Labs    09/20/21 1759  TSH 0.390     Recent Results (from the past 240 hour(s))  Resp Panel by RT-PCR (Flu A&B, Covid) Nasopharyngeal Swab     Status: None   Collection Time: 09/20/21  2:54 PM   Specimen: Nasopharyngeal Swab; Nasopharyngeal(NP) swabs in vial transport medium  Result  Value Ref Range Status   SARS Coronavirus 2 by RT PCR NEGATIVE NEGATIVE Final    Comment: (NOTE) SARS-CoV-2 target nucleic acids are NOT DETECTED.  The SARS-CoV-2 RNA is generally detectable in upper respiratory specimens during the acute phase of infection. The lowest concentration of SARS-CoV-2 viral copies this assay can detect is 138 copies/mL. A negative result does not preclude SARS-Cov-2 infection and should not be used as the sole basis for treatment or other patient management decisions. A negative result may occur with  improper specimen collection/handling, submission of specimen other than  nasopharyngeal swab, presence of viral mutation(s) within the areas targeted by this assay, and inadequate number of viral copies(<138 copies/mL). A negative result must be combined with clinical observations, patient history, and epidemiological information. The expected result is Negative.  Fact Sheet for Patients:  BloggerCourse.com  Fact Sheet for Healthcare Providers:  SeriousBroker.it  This test is no t yet approved or cleared by the [place] FDA and  has been authorized for detection and/or diagnosis of SARS-CoV-2 by FDA under an Emergency Use Authorization (EUA). This EUA will remain  in effect (meaning this test can be used) for the duration of the COVID-19 declaration under Section 564(b)(1) of the Act, 21 U.S.C.section 360bbb-3(b)(1), unless the authorization is terminated  or revoked sooner.       Influenza A by PCR NEGATIVE NEGATIVE Final   Influenza B by PCR NEGATIVE NEGATIVE Final    Comment: (NOTE) The Xpert Xpress SARS-CoV-2/FLU/RSV plus assay is intended as an aid in the diagnosis of influenza from Nasopharyngeal swab specimens and should not be used as a sole basis for treatment. Nasal washings and aspirates are unacceptable for Xpert Xpress SARS-CoV-2/FLU/RSV testing.  Fact Sheet for  Patients: BloggerCourse.com  Fact Sheet for Healthcare Providers: SeriousBroker.it  This test is not yet approved or cleared by the [place] FDA and has been authorized for detection and/or diagnosis of SARS-CoV-2 by FDA under an Emergency Use Authorization (EUA). This EUA will remain in effect (meaning this test can be used) for the duration of the COVID-19 declaration under Section 564(b)(1) of the Act, 21 U.S.C. section 360bbb-3(b)(1), unless the authorization is terminated or revoked.  Performed at [facility], [address], [place], [place] [address]       Radiology Studies: DG Abd 1 View  Result Date: 09/21/2021 CLINICAL DATA:  Nausea and vomiting EXAM: ABDOMEN - 1 VIEW COMPARISON:  None. FINDINGS: The bowel gas pattern is normal. No radio-opaque calculi or other significant radiographic abnormality are seen. IMPRESSION: Nonobstructive pattern of bowel gas. Electronically Signed   By: Jearld Lesch M.D.   On: 09/21/2021 13:33       LOS: 0 days   Dakayla Disanti Rito Ehrlich  [facility] Pager on [email or web address]  09/21/2021, 1:45 PM

## 2021-09-21 NOTE — Plan of Care (Signed)

## 2021-09-22 ENCOUNTER — Encounter (HOSPITAL_COMMUNITY)
Admission: EM | Disposition: A | Discharge status: Home / Self Care | Payer: Self-pay | Source: Home / Self Care | Attending: Internal Medicine

## 2021-09-22 ENCOUNTER — Inpatient Hospital Stay (HOSPITAL_COMMUNITY): Payer: Self-pay | Admitting: Anesthesiology

## 2021-09-22 ENCOUNTER — Encounter (HOSPITAL_COMMUNITY): Payer: Self-pay | Admitting: Internal Medicine

## 2021-09-22 DIAGNOSIS — K2210 Ulcer of esophagus without bleeding: Secondary | ICD-10-CM

## 2021-09-22 HISTORY — PX: BIOPSY: SHX5522

## 2021-09-22 HISTORY — PX: ESOPHAGOGASTRODUODENOSCOPY (EGD) WITH PROPOFOL: SHX5813

## 2021-09-22 LAB — COMPREHENSIVE METABOLIC PANEL
ALT: 27 U/L (ref 0–44)
AST: 19 U/L (ref 15–41)
Albumin: 3.8 g/dL (ref 3.5–5.0)
Alkaline Phosphatase: 30 U/L — ABNORMAL LOW (ref 38–126)
Anion gap: 7 (ref 5–15)
BUN: 7 mg/dL (ref 6–20)
CO2: 23 mmol/L (ref 22–32)
Calcium: 8.9 mg/dL (ref 8.9–10.3)
Chloride: 104 mmol/L (ref 98–111)
Creatinine, Ser: 0.71 mg/dL (ref 0.44–1.00)
GFR, Estimated: >60 mL/min (ref >60)
Glucose, Bld: 89 mg/dL (ref 70–99)
Potassium: 3.4 mmol/L — ABNORMAL LOW (ref 3.5–5.1)
Sodium: 134 mmol/L — ABNORMAL LOW (ref 135–145)
Total Bilirubin: 0.8 mg/dL (ref 0.3–1.2)
Total Protein: 6.8 g/dL (ref 6.5–8.1)

## 2021-09-22 LAB — CBC
HCT: 39.2 % (ref 36.0–46.0)
Hemoglobin: 12.9 g/dL (ref 12.0–15.0)
MCH: 30.2 pg (ref 26.0–34.0)
MCHC: 32.9 g/dL (ref 30.0–36.0)
MCV: 91.8 fL (ref 80.0–100.0)
Platelets: 250 10*3/uL (ref 150–400)
RBC: 4.27 MIL/uL (ref 3.87–5.11)
RDW: 13.1 % (ref 11.5–15.5)
WBC: 9.1 10*3/uL (ref 4.0–10.5)
nRBC: 0 % (ref 0.0–0.2)

## 2021-09-22 SURGERY — ESOPHAGOGASTRODUODENOSCOPY (EGD) WITH PROPOFOL
Anesthesia: Monitor Anesthesia Care

## 2021-09-22 MED ORDER — DEXMEDETOMIDINE (PRECEDEX) IN NS 20 MCG/5ML (4 MCG/ML) IV SYRINGE
PREFILLED_SYRINGE | INTRAVENOUS | Status: DC | PRN
  Administered 2021-09-22: 8 ug via INTRAVENOUS
  Administered 2021-09-22 (×2): 4 ug via INTRAVENOUS

## 2021-09-22 MED ORDER — PROPOFOL 10 MG/ML IV BOLUS
INTRAVENOUS | Status: DC | PRN
  Administered 2021-09-22: 1 mg via INTRAVENOUS
  Administered 2021-09-22 (×3): 20 mg via INTRAVENOUS

## 2021-09-22 MED ORDER — POTASSIUM CHLORIDE CRYS ER 20 MEQ PO TBCR
40.0000 meq | EXTENDED_RELEASE_TABLET | Freq: Once | ORAL | Status: AC
  Administered 2021-09-22: 40 meq via ORAL
  Filled 2021-09-22: qty 2

## 2021-09-22 MED ORDER — LACTATED RINGERS IV SOLN
INTRAVENOUS | Status: AC | PRN
  Administered 2021-09-22: 1000 mL via INTRAVENOUS

## 2021-09-22 MED ORDER — PROPOFOL 500 MG/50ML IV EMUL
INTRAVENOUS | Status: DC | PRN
  Administered 2021-09-22: 200 ug/kg/min via INTRAVENOUS

## 2021-09-22 SURGICAL SUPPLY — 15 items

## 2021-09-22 NOTE — Interval H&P Note (Signed)
History and Physical Interval Note: Patient feeling much better today. Has been NPO, on antiemetics and PPI. She states no further nausea or vomiting, her pain has completely resolved. She is feeling much better. Given her course to day with numerous ED visit and now admission for recurrent symptoms, will proceed with EGD to clear her upper tract. Have discussed risks / benefits of EGD and anesthesia and she wishes to proceed. Further recommendations pending the results.  09/22/2021 1:29 PM  Christina Mays  has presented today for surgery, with the diagnosis of nausea, vomiting.  The various methods of treatment have been discussed with the patient and family. After consideration of risks, benefits and other options for treatment, the patient has consented to  Procedure(s): ESOPHAGOGASTRODUODENOSCOPY (EGD) WITH PROPOFOL (N/A) as a surgical intervention.  The patient's history has been reviewed, patient examined, no change in status, stable for surgery.  I have reviewed the patient's chart and labs.  Questions were answered to the patient's satisfaction.     Viviann Spare P Irfan Veal

## 2021-09-22 NOTE — Progress Notes (Signed)
[facility] PROGRESS NOTE   Christina Mays BOF:[id number] DOB: [birth year] DOA: 09/20/2021  PCP: Overton Mam, DO  Brief History/Interval Summary: 20 y.o. female with medical history significant of migraines who presented to ED for second time for Nausea, vomiting and abdominal pain that started on Tuesday this week. She has had about 10 episodes of vomiting/day. She has zofran, phenergan and reglan, but states these do not help her. She states she has had issues with vomiting x 4 years. She smokes marijuana nearly daily. She states she had drank some alcohol on Tuesday and she thinks that is what set off her N/V and stomach pain. Her stomach pain is located in her RLQ and is the same that she has had in the past. CT scan done in 08/18/21 did not show any acute findings. She has had multiple ED visits for nausea vomiting and abdominal pain thought to be secondary to cannabis hyperemesis or corpus luteum cyst. Has visited ED 9/12, 913. 914, 9/17, 10/14, 10/16 and twice on 10/17.    Reason for Visit: Cannabis hyperemesis  Consultants: None  Procedures: None    Subjective/Interval History: Denies complaints.  Reportedly last vomited at around midday yesterday and none since.  However has been n.p.o. for procedure.  Had a normal BM this morning.  Denies abdominal pain, nausea or vomiting.  Awaiting EGD at approximately 1 PM today.    Assessment/Plan:  Intractable nausea and vomiting Most likely secondary to cannabis use.  Urine drug screen was positive for same.  Patient smokes at least 3 times a week and also has edible formulations.  Recent CT scan done in September did not show any acute findings.  KUB without acute findings.  Treated symptomatically/supportively.  IV fluids.  Currently n.p.o. for EGD this afternoon.  ADDENDUM Prior [facility] MD discussed with patient's mother. Apparently symptoms have been ongoing on and off for several months. Patient has apparently lost a lot of  weight as well. In view of this patient may benefit from inpatient GI input.  [facility] input appreciated.  Although cannabis use is on differentials, ruling out other etiologies as per their detailed consult note from 10/18.  Await GI recommendations postprocedure.  Hypokalemia Improved.  Additional supplements ordered today.  Magnesium was 2.3 recently.  Abdominal pain Resolved.  Lipase was normal.  LFTs were normal.  PPI being given.  History of ovarian cyst Follows with her GYN on a regular basis.  Marijuana abuse Likely contributing to her symptoms.  She was counseled regarding cessation.  DVT Prophylaxis: Lovenox Code Status: Full code Family Communication: Discussed with the patient and fianc at bedside. Disposition Plan: Hopefully return home when improved and pending GI clearance.  Status is: Inpatient      Medications: Scheduled:  enoxaparin (LOVENOX) injection  40 mg Subcutaneous Q24H   LORazepam  0.5 mg Intravenous Once   ondansetron (ZOFRAN) IV  4 mg Intravenous Q8H   pantoprazole (PROTONIX) IV  40 mg Intravenous Q24H   prochlorperazine  10 mg Intravenous Once   Continuous:  0.45 % NaCl with KCl 20 mEq / L 100 mL/hr at 09/22/21 0915   promethazine (PHENERGAN) injection (IM or IVPB) Stopped (09/21/21 1304)   DPO:[id number] **OR** acetaminophen, morphine injection, promethazine (PHENERGAN) injection (IM or IVPB)  Antibiotics: Anti-infectives (From admission, onward)    None       Objective:  Vital Signs  Vitals:   09/21/21 1353 09/21/21 1354 09/21/21 2048 09/22/21 0312  BP: 128/80  128/77 106/64  Pulse:  71  74 65  Resp: 20  19 20   Temp:  99.9 F (37.7 C) 98.6 F (37 C) 98.1 F (36.7 C)  TempSrc:  Oral  Oral  SpO2: 100%  100% 100%  Weight:      Height:        Intake/Output Summary (Last 24 hours) at 09/22/2021 1204 Last data filed at 09/22/2021 0410 Gross per 24 hour  Intake 1592.91 ml  Output --  Net 1592.91 ml   Filed Weights    09/20/21 1208  Weight: 45.6 kg    General appearance: Young female, moderately built, thinly nourished, appears to be in good spirits, lying comfortably in bed.  Actually patient's "fianc" was lying in bed along with her. Resp: Clear to auscultation bilaterally.  Normal effort Cardio: S1-S2 is normal regular.  No S3-S4.  No rubs murmurs or bruit GI: Abdomen is soft.  Nondistended, soft and nontender.  No organomegaly or masses appreciated.  Normal bowel sounds heard. Extremities: No edema.  Full range of motion of lower extremities. Neurologic: Alert and oriented x3.  No focal neurological deficits.    Lab Results:  Data Reviewed: I have personally reviewed following labs and imaging studies  CBC: Recent Labs  Lab 09/17/21 1353 09/20/21 0934 09/21/21 0436 09/22/21 0453  WBC 10.1 7.8 8.0 9.1  HGB 14.4 16.1* 12.9 12.9  HCT 41.0 45.4 38.5 39.2  MCV 85.4 84.7 89.7 91.8  PLT 282 308 239 250    Basic Metabolic Panel: Recent Labs  Lab 09/17/21 1353 09/20/21 1025 09/20/21 1322 09/20/21 1759 09/21/21 0436 09/22/21 0453  NA 137 135  --   --  137 134*  K 2.5* 2.5*  --  2.7* 3.3* 3.4*  CL 94* 91*  --   --  102 104  CO2 29 33*  --   --  29 23  GLUCOSE 130* 101*  --   --  96 89  BUN 9 12  --   --  10 7  CREATININE 0.59 0.69  --   --  0.77 0.71  CALCIUM 10.7* 9.6  --   --  9.1 8.9  MG  --   --  2.3  --   --   --     GFR: Estimated Creatinine Clearance: 79.9 mL/min (by C-G formula based on SCr of 0.71 mg/dL).  Liver Function Tests: Recent Labs  Lab 09/17/21 1353 09/20/21 1025 09/22/21 0453  AST 24 24 19   ALT 36 38 27  ALKPHOS 38 33* 30*  BILITOT 0.7 0.8 0.8  PROT 8.5* 7.3 6.8  ALBUMIN 5.0 4.3 3.8    Recent Labs  Lab 09/17/21 1353 09/20/21 1025  LIPASE <10* 11     Thyroid Function Tests: Recent Labs    09/20/21 1759  TSH 0.390     Recent Results (from the past 240 hour(s))  Resp Panel by RT-PCR (Flu A&B, Covid) Nasopharyngeal Swab     Status:  None   Collection Time: 09/20/21  2:54 PM   Specimen: Nasopharyngeal Swab; Nasopharyngeal(NP) swabs in vial transport medium  Result Value Ref Range Status   SARS Coronavirus 2 by RT PCR NEGATIVE NEGATIVE Final    Comment: (NOTE) SARS-CoV-2 target nucleic acids are NOT DETECTED.  The SARS-CoV-2 RNA is generally detectable in upper respiratory specimens during the acute phase of infection. The lowest concentration of SARS-CoV-2 viral copies this assay can detect is 138 copies/mL. A negative result does not preclude SARS-Cov-2 infection and should not be used as the  sole basis for treatment or other patient management decisions. A negative result may occur with  improper specimen collection/handling, submission of specimen other than nasopharyngeal swab, presence of viral mutation(s) within the areas targeted by this assay, and inadequate number of viral copies(<138 copies/mL). A negative result must be combined with clinical observations, patient history, and epidemiological information. The expected result is Negative.  Fact Sheet for Patients:  BloggerCourse.com  Fact Sheet for Healthcare Providers:  SeriousBroker.it  This test is no t yet approved or cleared by the [place] FDA and  has been authorized for detection and/or diagnosis of SARS-CoV-2 by FDA under an Emergency Use Authorization (EUA). This EUA will remain  in effect (meaning this test can be used) for the duration of the COVID-19 declaration under Section 564(b)(1) of the Act, 21 U.S.C.section 360bbb-3(b)(1), unless the authorization is terminated  or revoked sooner.       Influenza A by PCR NEGATIVE NEGATIVE Final   Influenza B by PCR NEGATIVE NEGATIVE Final    Comment: (NOTE) The Xpert Xpress SARS-CoV-2/FLU/RSV plus assay is intended as an aid in the diagnosis of influenza from Nasopharyngeal swab specimens and should not be used as a sole basis for  treatment. Nasal washings and aspirates are unacceptable for Xpert Xpress SARS-CoV-2/FLU/RSV testing.  Fact Sheet for Patients: BloggerCourse.com  Fact Sheet for Healthcare Providers: SeriousBroker.it  This test is not yet approved or cleared by the [place] FDA and has been authorized for detection and/or diagnosis of SARS-CoV-2 by FDA under an Emergency Use Authorization (EUA). This EUA will remain in effect (meaning this test can be used) for the duration of the COVID-19 declaration under Section 564(b)(1) of the Act, 21 U.S.C. section 360bbb-3(b)(1), unless the authorization is terminated or revoked.  Performed at [facility], [address], [place], [place] [address]       Radiology Studies: DG Abd 1 View  Result Date: 09/21/2021 CLINICAL DATA:  Nausea and vomiting EXAM: ABDOMEN - 1 VIEW COMPARISON:  None. FINDINGS: The bowel gas pattern is normal. No radio-opaque calculi or other significant radiographic abnormality are seen. IMPRESSION: Nonobstructive pattern of bowel gas. Electronically Signed   By: Jearld Lesch M.D.   On: 09/21/2021 13:33       LOS: 1 day   Marcellus Scott, MD, [place], [facility]. [facility]  To contact the attending provider between 7A-7P or the covering provider during after hours 7P-7A, please log into the web site [email or web address] and access using universal [place] password for that web site. If you do not have the password, please call the hospital operator.

## 2021-09-22 NOTE — Anesthesia Postprocedure Evaluation (Signed)
Anesthesia Post Note  Patient: Christina Mays  Procedure(s) Performed: ESOPHAGOGASTRODUODENOSCOPY (EGD) WITH PROPOFOL BIOPSY     Patient location during evaluation: PACU Anesthesia Type: MAC Level of consciousness: awake and alert Pain management: pain level controlled Vital Signs Assessment: post-procedure vital signs reviewed and stable Respiratory status: nonlabored ventilation, respiratory function stable and spontaneous breathing Cardiovascular status: stable and blood pressure returned to baseline Postop Assessment: no apparent nausea or vomiting Anesthetic complications: no   No notable events documented.  Last Vitals:  Vitals:   09/22/21 1420 09/22/21 1423  BP: (!) 121/58 108/62  Pulse: 87 90  Resp: 18 16  Temp: 37.1 C   SpO2: 100% 100%    Last Pain:  Vitals:   09/22/21 1423  TempSrc:   PainSc: 0-No pain                 Jentri Aye A.

## 2021-09-22 NOTE — Anesthesia Procedure Notes (Signed)
Procedure Name: MAC Date/Time: 09/22/2021 2:12 PM Performed by: Lieutenant Diego, CRNA Pre-anesthesia Checklist: Patient identified, Emergency Drugs available, Suction available, Patient being monitored and Timeout performed Patient Re-evaluated:Patient Re-evaluated prior to induction Oxygen Delivery Method: Simple face mask Preoxygenation: Pre-oxygenation with 100% oxygen Induction Type: IV induction

## 2021-09-22 NOTE — Transfer of Care (Signed)
Immediate Anesthesia Transfer of Care Note  Patient: Amilah Greenspan  Procedure(s) Performed: ESOPHAGOGASTRODUODENOSCOPY (EGD) WITH PROPOFOL BIOPSY  Patient Location: Endoscopy Unit  Anesthesia Type:MAC  Level of Consciousness: drowsy  Airway & Oxygen Therapy: Patient Spontanous Breathing and Patient connected to face mask oxygen  Post-op Assessment: Report given to RN and Post -op Vital signs reviewed and stable  Post vital signs: Reviewed and stable  Last Vitals:  Vitals Value Taken Time  BP    Temp    Pulse 87 09/22/21 1418  Resp 20 09/22/21 1418  SpO2 100 % 09/22/21 1418  Vitals shown include unvalidated device data.  Last Pain:  Vitals:   09/22/21 0721  TempSrc:   PainSc: 0-No pain      Patients Stated Pain Goal: 0 (30/09/23 3007)  Complications: No notable events documented.

## 2021-09-22 NOTE — Op Note (Signed)
[facility] Patient Name: Christina Mays Procedure Date: 09/22/2021 MRN: [id number] Attending MD: Carlota Raspberry. Havery Moros , MD Date of Birth: 06-Oct-2001 CSN: [id number] Age: 20 Admit Type: Outpatient Procedure:                Upper GI endoscopy Indications:              episodic refractory nausea with vomiting, and                            adominal pain. CT and labs without clear cause.                            History of Cannabis use. Patient feels                            significantly improved today with conservative                            measures (PPI, antiemetics) Providers:                Carlota Raspberry. Havery Moros, MD, Doristine Johns, RN,                            Jaci Carrel, RN, Luan Moore, Technician,                            Signa Kell, CRNA Referring MD:              Medicines:                Monitored Anesthesia Care Complications:            No immediate complications. Estimated blood loss:                            Minimal. Estimated Blood Loss:     Estimated blood loss was minimal. Procedure:                Pre-Anesthesia Assessment:                           - Prior to the procedure, a History and Physical                            was performed, and patient medications and                            allergies were reviewed. The patient's tolerance of                            previous anesthesia was also reviewed. The risks                            and benefits of the procedure and the sedation                            options and risks were discussed with  the patient.                            All questions were answered, and informed consent                            was obtained. Prior Anticoagulants: The patient has                            taken no previous anticoagulant or antiplatelet                            agents. ASA Grade Assessment: II - A patient with                            mild systemic disease. After  reviewing the risks                            and benefits, the patient was deemed in                            satisfactory condition to undergo the procedure.                           After obtaining informed consent, the endoscope was                            passed under direct vision. Throughout the                            procedure, the patient's blood pressure, pulse, and                            oxygen saturations were monitored continuously. The                            [id number]-[id number] ([id number]) Olympus endoscope was introduced                            through the mouth, and advanced to the second part                            of duodenum. The upper GI endoscopy was                            accomplished without difficulty. The patient                            tolerated the procedure well. Scope In: Scope Out: Findings:      Esophagogastric landmarks were identified: the Z-line was found at 34       cm, the gastroesophageal junction was found at 34 cm and the upper       extent of the gastric folds was found at 35 cm from the incisors.  A 1 cm hiatal hernia was present.      Localized erosions were found in the lower third of the esophagus.      The exam of the esophagus was otherwise normal.      The entire examined stomach was normal. Biopsies were taken with a cold       forceps for Helicobacter pylori testing.      The duodenal bulb and second portion of the duodenum were normal.       Biopsies for histology were taken with a cold forceps for evaluation of       celiac disease.      Unfortunately due to computer issues, no photos were recorded on       Provation other than what is listed for the esophagus. Impression:               - Esophagogastric landmarks identified.                           - 1 cm hiatal hernia.                           - Erosions in the distal esophagus likely due to                            vomiting.                           -  Normal stomach. Biopsied.                           - Normal duodenal bulb and second portion of the                            duodenum. Biopsied.                           No gastric outlet obstruction or concerning                            pathology to cause symptoms. Patient has                            significantly improved with conservative measures                            thus far. Moderate Sedation:      No moderate sedation, case performed with MAC Recommendation:           - Return patient to hospital ward for ongoing care.                           - Clear liquid diet as tolerated now.                           - Continue present medications (protonix 75m /                            day, scheduled Zofran  every 8 hours)                           - Await lab workup as previously recommended                           - Recommend complete abstinence of cannabis                           - Await pathology results with further                            recommendations                           - If patient does well with liquids today, advance                            to soft diet tomorrow                           - Will reassess patient in the AM, call with                            questions in the interim Procedure Code(s):        --- Professional ---                           [phone], Esophagogastroduodenoscopy, flexible,                            transoral; with biopsy, single or multiple Diagnosis Code(s):        --- Professional ---                           K44.9, Diaphragmatic hernia without obstruction or                            gangrene                           K22.10, Ulcer of esophagus without bleeding                           R11.2, Nausea with vomiting, unspecified CPT copyright 2019 American Medical Association. All rights reserved. The codes documented in this report are preliminary and upon coder review may  be revised to meet current  compliance requirements. Remo Lipps P. Cariana Karge, MD 09/22/2021 2:21:25 PM This report has been signed electronically. Number of Addenda: 0

## 2021-09-22 NOTE — Anesthesia Preprocedure Evaluation (Signed)
Anesthesia Evaluation  Patient identified by MRN, date of birth, ID band Patient awake    Reviewed: Allergy & Precautions, NPO status , Patient's Chart, lab work & pertinent test results  Airway Mallampati: II  TM Distance: >3 FB     Dental   Pulmonary    breath sounds clear to auscultation       Cardiovascular negative cardio ROS   Rhythm:Regular Rate:Normal     Neuro/Psych  Headaches,    GI/Hepatic Neg liver ROS,   Endo/Other    Renal/GU negative Renal ROS     Musculoskeletal   Abdominal   Peds  Hematology   Anesthesia Other Findings   Reproductive/Obstetrics                             Anesthesia Physical Anesthesia Plan  ASA: 2  Anesthesia Plan: MAC   Post-op Pain Management:    Induction: Intravenous  PONV Risk Score and Plan: 2 and Ondansetron and Dexamethasone  Airway Management Planned: Nasal Cannula and Simple Face Mask  Additional Equipment:   Intra-op Plan:   Post-operative Plan:   Informed Consent: I have reviewed the patients History and Physical, chart, labs and discussed the procedure including the risks, benefits and alternatives for the proposed anesthesia with the patient or authorized representative who has indicated his/her understanding and acceptance.     Dental advisory given  Plan Discussed with: Anesthesiologist and CRNA  Anesthesia Plan Comments:         Anesthesia Quick Evaluation

## 2021-09-23 ENCOUNTER — Telehealth: Payer: Self-pay

## 2021-09-23 ENCOUNTER — Encounter (HOSPITAL_COMMUNITY): Payer: Self-pay | Admitting: Gastroenterology

## 2021-09-23 LAB — PORPHYRINS, FRACTIONATED URINE (TIMED COLLECTION): Total Volume: 75

## 2021-09-23 LAB — BASIC METABOLIC PANEL WITH GFR
Anion gap: 4 — ABNORMAL LOW (ref 5–15)
BUN: 9 mg/dL (ref 6–20)
CO2: 25 mmol/L (ref 22–32)
Calcium: 9.2 mg/dL (ref 8.9–10.3)
Chloride: 104 mmol/L (ref 98–111)
Creatinine, Ser: 0.68 mg/dL (ref 0.44–1.00)
GFR, Estimated: >60 mL/min
Glucose, Bld: 89 mg/dL (ref 70–99)
Potassium: 4.4 mmol/L (ref 3.5–5.1)
Sodium: 133 mmol/L — ABNORMAL LOW (ref 135–145)

## 2021-09-23 LAB — SURGICAL PATHOLOGY

## 2021-09-23 LAB — CORTISOL-AM, BLOOD: Cortisol - AM: 9.9 ug/dL (ref 6.7–22.6)

## 2021-09-23 MED ORDER — ONDANSETRON 4 MG PO TBDP: 4.0000 mg | ORAL_TABLET | Freq: Three times a day (TID) | ORAL | 0 refills | Status: AC

## 2021-09-23 MED ORDER — PANTOPRAZOLE SODIUM 40 MG PO TBEC
40.0000 mg | DELAYED_RELEASE_TABLET | Freq: Every day | ORAL | Status: DC
  Administered 2021-09-23: 40 mg via ORAL
  Filled 2021-09-23: qty 1

## 2021-09-23 MED ORDER — ACETAMINOPHEN 500 MG PO TABS: 500.0000 mg | ORAL_TABLET | Freq: Four times a day (QID) | ORAL | Status: AC | PRN

## 2021-09-23 MED ORDER — PANTOPRAZOLE SODIUM 40 MG PO TBEC: 40.0000 mg | DELAYED_RELEASE_TABLET | Freq: Every day | ORAL | 1 refills | Status: AC

## 2021-09-23 NOTE — Discharge Instructions (Signed)

## 2021-09-23 NOTE — Discharge Summary (Signed)
Physician Discharge Summary  Christina Mays TSV:[id number] DOB: 2001-05-31  PCP: Overton Mam, DO  Admitted from: Home Discharged to: Home  Admit date: 09/20/2021 Discharge date: 09/23/2021  Recommendations for Outpatient Follow-up:    Follow-up Information     Overton Mam, DO. Schedule an appointment as soon as possible for a visit in 1 week(s).   Specialty: Family Medicine Why: To be seen with repeat labs (CBC & BMP).        Benancio Deeds, MD. Call.   Specialty: Gastroenterology Why: As needed, If symptoms worsen Contact information: [address] [place] [address] [phone]                  Home Health: None    Equipment/Devices: None    Discharge Condition: Improved and stable.   Code Status: Full Code Diet recommendation:  Discharge Diet Orders (From admission, onward)     Start     Ordered   09/23/21 0000  Diet - low sodium heart healthy        09/23/21 1252   09/21/21 0000  Diet general        09/21/21 0916             Discharge Diagnoses:  Principal Problem:   Intractable nausea and vomiting Active Problems:   Hypokalemia   Right lower quadrant abdominal pain   Brief Summary: Brief History/Interval Summary: 20 y.o. female with medical history significant of migraines who presented to ED for second time for Nausea, vomiting and abdominal pain that started on Tuesday this week. She has had about 10 episodes of vomiting/day. She has zofran, phenergan and reglan, but states these do not help her. She states she has had issues with vomiting x 4 years. She smokes marijuana nearly daily. She states she had drank some alcohol on Tuesday and she thinks that is what set off her N/V and stomach pain. Her stomach pain is located in her RLQ and is the same that she has had in the past. CT scan done in 08/18/21 did not show any acute findings. She has had multiple ED visits for nausea vomiting and abdominal pain  thought to be secondary to cannabis hyperemesis or corpus luteum cyst. Has visited ED 9/12, 913. 914, 9/17, 10/14, 10/16 and twice on 10/17.      Reason for Visit: Intractable nausea and vomiting  Assessment/Plan:   Intractable nausea and vomiting Most likely secondary to cannabis use.  Urine drug screen was positive for same.  Patient smokes at least 3 times a week and also has edible formulations.  Recent CT scan done in September did not show any acute findings.  KUB without acute findings.  Treated symptomatically/supportively.  Bowel rest, IV fluids, PPI, scheduled IV Zofran.  Despite these and due to history provided by patient's family as noted below, [facility] was consulted.  She underwent EGD with results as below which only showed a small hiatal hernia and distal esophageal erosions felt to be due to vomiting.   Prior TRH MD discussed with patient's mother. Apparently symptoms have been ongoing on and off for several months. Patient has apparently lost a lot of weight as well. In view of this patient may benefit from inpatient GI input.   GI input appreciated.  Although cannabis use is on differentials, ruling out other etiologies as per their detailed consult note from 10/18.  As per GI follow-up today, cortisol level normal, urine porphyrin studies pending and can  be followed up as outpatient by her PCP.  Absolute cessation of cannabis has been counseled to patient and fianc at bedside by multiple providers.  They indicate that if symptoms recur then may consider starting TCA for?  Cyclical vomiting and functional abdominal pain.  GI has cleared her for discharge home on Protonix 40 Mg daily, Zofran 4 mg 3 times daily scheduled for few days and then as needed.  Hypokalemia Replaced and normal.  Magnesium was 2.3 recently.  Abdominal pain Resolved.  Lipase was normal.  LFTs were normal.  PPI being given.   History of ovarian cyst Follows with her GYN on a regular basis.   Continue outpatient follow-up.  Marijuana abuse Likely contributing to her symptoms.  She was counseled regarding cessation.  Small liver lesion on CT A/P 08/18/2021 As per GI, focal fat along the falciform ligament.  There is a rounded hypodensity in the right lobe of the liver which is too small to characterize, possibly a small cyst or hemangioma.  Outpatient follow-up with PCP as deemed necessary.  Indeterminate 5 mm left lower lobe nodule noted on CT A/P 08/18/2021: As per radiology, no follow-up needed if patient is low risk which she appears to be but will defer decision making to her outpatient PCP if this needs to be further followed up.  Per radiology, noncontrast chest CT can be considered in 12 months if she is high risk.    Consultations: [facility]  Procedures: EGD 09/22/2021:  Impression  - Esophagogastric landmarks identified. - 1 cm hiatal hernia. - Erosions in the distal esophagus likely due to vomiting. - Normal stomach. Biopsied. - Normal duodenal bulb and second portion of the duodenum. Biopsied. No gastric outlet obstruction or concerning pathology to cause symptoms. Patient has significantly improved with conservative measures thus far.  Recommendation:  - Return patient to hospital ward for ongoing care. - Clear liquid diet as tolerated now. - Continue present medications (protonix 40mg  / day, scheduled Zofran every 8 hours) - Await lab workup as previously recommended - Recommend complete abstinence of cannabis - Await pathology results with further recommendations-GI can follow-up with [place] as outpatient.    Discharge Instructions  Discharge Instructions     Call MD for:  difficulty breathing, headache or visual disturbances   Complete by: As directed    Call MD for:  extreme fatigue   Complete by: As directed    Call MD for:  persistant dizziness or light-headedness   Complete by: As directed    Call MD for:  persistant nausea and vomiting    Complete by: As directed    Call MD for:  severe uncontrolled pain   Complete by: As directed    Call MD for:  temperature >100.4   Complete by: As directed    Diet - low sodium heart healthy   Complete by: As directed    Diet general   Complete by: As directed    Discharge instructions   Complete by: As directed    Please be sure to follow-up with your primary care provider by next week. Avoid marijuana use as it could be triggering your nausea and vomiting.  Seek attention if your symptoms recur.   Increase activity slowly   Complete by: As directed         Medication List     STOP taking these medications    dicyclomine 20 MG tablet Commonly known as: BENTYL   famotidine 20 MG tablet Commonly known as: PEPCID  HYDROcodone-acetaminophen 5-325 MG tablet Commonly known as: NORCO/VICODIN   metoCLOPramide 10 MG tablet Commonly known as: REGLAN   promethazine 12.5 MG suppository Commonly known as: PHENERGAN       TAKE these medications    acetaminophen 500 MG tablet Commonly known as: TYLENOL Take 1 tablet (500 mg total) by mouth every 6 (six) hours as needed for mild pain or moderate pain. What changed:  how much to take reasons to take this   ondansetron 4 MG disintegrating tablet Commonly known as: Zofran ODT Take 1 tablet (4 mg total) by mouth 3 (three) times daily. Take 1 tab (4 mg total) 3 times daily for 4 to 5 days, then take 1 tab (4 mg total) 2 or 3 times daily only as needed for nausea or vomiting. What changed:  medication strength how much to take when to take this reasons to take this additional instructions   pantoprazole 40 MG tablet Commonly known as: PROTONIX Take 1 tablet (40 mg total) by mouth daily.       Allergies  Allergen Reactions   Pepto-Bismol [Bismuth Subsalicylate] Nausea And Vomiting      Procedures/Studies: DG Abd 1 View  Result Date: 09/21/2021 CLINICAL DATA:  Nausea and vomiting EXAM: ABDOMEN - 1 VIEW  COMPARISON:  None. FINDINGS: The bowel gas pattern is normal. No radio-opaque calculi or other significant radiographic abnormality are seen. IMPRESSION: Nonobstructive pattern of bowel gas. Electronically Signed   By: Jearld Lesch M.D.   On: 09/21/2021 13:33      Subjective: Has not had nausea or vomiting since midday on 09/21/2021.  Post EGD, diet was advanced and she has tolerated soft diet today without nausea, vomiting or abdominal pain.  Had BM.    Discharge Exam:  Vitals:   09/22/21 1440 09/22/21 1610 09/22/21 1941 09/23/21 0406  BP: 130/62 131/65 119/63 104/64  Pulse: 65 68 80 68  Resp: 19 17 16 18   Temp:  98.7 F (37.1 C) 98.7 F (37.1 C) 98.6 F (37 C)  TempSrc:  Oral Axillary Oral  SpO2: 100% 100% 100% 100%  Weight:      Height:          General appearance: Young female, moderately built, thinly nourished, appears to be in good spirits, seen ambulating comfortably in the room on my arrival into the room.  Subsequently she laid down in bed.  Fianc at bedside. Resp: Clear to auscultation bilaterally.  Normal effort Cardio: S1-S2 is normal regular.  No S3-S4.  No rubs murmurs or bruit GI: Abdomen is soft.  Nondistended, soft and nontender.  No organomegaly or masses appreciated.  Normal bowel sounds heard. Extremities: No edema.  Full range of motion of lower extremities. Neurologic: Alert and oriented x3.  No focal neurological deficits.     The results of significant diagnostics from this hospitalization (including imaging, microbiology, ancillary and laboratory) are listed below for reference.     Microbiology: Recent Results (from the past 240 hour(s))  Resp Panel by RT-PCR (Flu A&B, Covid) Nasopharyngeal Swab     Status: None   Collection Time: 09/20/21  2:54 PM   Specimen: Nasopharyngeal Swab; Nasopharyngeal(NP) swabs in vial transport medium  Result Value Ref Range Status   SARS Coronavirus 2 by RT PCR NEGATIVE NEGATIVE Final    Comment:  (NOTE) SARS-CoV-2 target nucleic acids are NOT DETECTED.  The SARS-CoV-2 RNA is generally detectable in upper respiratory specimens during the acute phase of infection. The lowest concentration of SARS-CoV-2 viral copies this  assay can detect is 138 copies/mL. A negative result does not preclude SARS-Cov-2 infection and should not be used as the sole basis for treatment or other patient management decisions. A negative result may occur with  improper specimen collection/handling, submission of specimen other than nasopharyngeal swab, presence of viral mutation(s) within the areas targeted by this assay, and inadequate number of viral copies(<138 copies/mL). A negative result must be combined with clinical observations, patient history, and epidemiological information. The expected result is Negative.  Fact Sheet for Patients:  BloggerCourse.com  Fact Sheet for Healthcare Providers:  SeriousBroker.it  This test is no t yet approved or cleared by the [place] FDA and  has been authorized for detection and/or diagnosis of SARS-CoV-2 by FDA under an Emergency Use Authorization (EUA). This EUA will remain  in effect (meaning this test can be used) for the duration of the COVID-19 declaration under Section 564(b)(1) of the Act, 21 U.S.C.section 360bbb-3(b)(1), unless the authorization is terminated  or revoked sooner.       Influenza A by PCR NEGATIVE NEGATIVE Final   Influenza B by PCR NEGATIVE NEGATIVE Final    Comment: (NOTE) The Xpert Xpress SARS-CoV-2/FLU/RSV plus assay is intended as an aid in the diagnosis of influenza from Nasopharyngeal swab specimens and should not be used as a sole basis for treatment. Nasal washings and aspirates are unacceptable for Xpert Xpress SARS-CoV-2/FLU/RSV testing.  Fact Sheet for Patients: BloggerCourse.com  Fact Sheet for Healthcare  Providers: SeriousBroker.it  This test is not yet approved or cleared by the [place] FDA and has been authorized for detection and/or diagnosis of SARS-CoV-2 by FDA under an Emergency Use Authorization (EUA). This EUA will remain in effect (meaning this test can be used) for the duration of the COVID-19 declaration under Section 564(b)(1) of the Act, 21 U.S.C. section 360bbb-3(b)(1), unless the authorization is terminated or revoked.  Performed at [facility], [address] [place], [place] [address]      Labs: CBC: Recent Labs  Lab 09/17/21 1353 09/20/21 0934 09/21/21 0436 09/22/21 0453  WBC 10.1 7.8 8.0 9.1  HGB 14.4 16.1* 12.9 12.9  HCT 41.0 45.4 38.5 39.2  MCV 85.4 84.7 89.7 91.8  PLT 282 308 239 250    Basic Metabolic Panel: Recent Labs  Lab 09/17/21 1353 09/20/21 1025 09/20/21 1322 09/20/21 1759 09/21/21 0436 09/22/21 0453 09/23/21 0522  NA 137 135  --   --  137 134* 133*  K 2.5* 2.5*  --  2.7* 3.3* 3.4* 4.4  CL 94* 91*  --   --  102 104 104  CO2 29 33*  --   --  29 23 25   GLUCOSE 130* 101*  --   --  96 89 89  BUN 9 12  --   --  10 7 9   CREATININE 0.59 0.69  --   --  0.77 0.71 0.68  CALCIUM 10.7* 9.6  --   --  9.1 8.9 9.2  MG  --   --  2.3  --   --   --   --     Liver Function Tests: Recent Labs  Lab 09/17/21 1353 09/20/21 1025 09/22/21 0453  AST 24 24 19   ALT 36 38 27  ALKPHOS 38 33* 30*  BILITOT 0.7 0.8 0.8  PROT 8.5* 7.3 6.8  ALBUMIN 5.0 4.3 3.8     Thyroid function studies Recent Labs    09/20/21 1759  TSH 0.390  Time coordinating discharge: 25 minutes  SIGNED:  Marcellus Scott, MD, FACP, [facility]. [facility]  To contact the attending provider between 7A-7P or the covering provider during after hours 7P-7A, please log into the web site [email or web address] and access using universal [place] password for that web site. If you do not have the password, please call the  hospital operator.

## 2021-09-23 NOTE — Telephone Encounter (Signed)
Patient has been scheduled for a hospital follow up with Dr. Adela Lank on Monday, 11/08/21 at 10:30 am. Letter mailed to patient with appt information.

## 2021-09-23 NOTE — Telephone Encounter (Signed)
-----   Message from Benancio Deeds, MD sent at 09/23/2021  1:38 PM EDT ----- Regarding: hospital follow up [facility] can you please book this patient for an office follow up with me in a few months, post hospital follow up for nausea / vomiting / abdominal pain. She is being discharged today. Thanks

## 2021-09-23 NOTE — Progress Notes (Signed)
[facility] Progress Note  CC:  Nausea, vomiting, abdominal pain  Subjective:  Still doing much better today.  No nausea or vomiting.  Wants to eat food so just ordered breakfast.  EGD 09/22/20: - Esophagogastric landmarks identified. - 1 cm hiatal hernia. - Erosions in the distal esophagus likely due to vomiting. - Normal stomach. Biopsied. - Normal duodenal bulb and second portion of the duodenum. Biopsied. No gastric outlet obstruction or concerning pathology to cause symptoms. Patient has significantly improved with conservative measures thus far.  Pathology pending.  Objective:  Vital signs in last 24 hours: Temp:  [98.6 F (37 C)-98.8 F (37.1 C)] 98.6 F (37 C) (10/20 0406) Pulse Rate:  [65-95] 68 (10/20 0406) Resp:  [12-19] 18 (10/20 0406) BP: (104-132)/(58-68) 104/64 (10/20 0406) SpO2:  [100 %] 100 % (10/20 0406) Last BM Date: 09/22/21 General:  Alert, Well-developed, in NAD Heart:  Regular rate and rhythm; no murmurs Pulm:  CTAB.  No W/R/R. Abdomen:  Soft, non-distended.  BS present.  Non-tender. Extremities:  Without edema. Neurologic:  Alert and oriented x 4;  grossly normal neurologically. Psych:  Alert and cooperative. Normal mood and affect.  Intake/Output from previous day: 10/19 0701 - 10/20 0700 In: 1525.7 [P.O.:600; I.V.:925.7] Out: -   Lab Results: Recent Labs    09/20/21 0934 09/21/21 0436 09/22/21 0453  WBC 7.8 8.0 9.1  HGB 16.1* 12.9 12.9  HCT 45.4 38.5 39.2  PLT 308 239 250   BMET Recent Labs    09/21/21 0436 09/22/21 0453 09/23/21 0522  NA 137 134* 133*  K 3.3* 3.4* 4.4  CL 102 104 104  CO2 29 23 25   GLUCOSE 96 89 89  BUN 10 7 9   CREATININE 0.77 0.71 0.68  CALCIUM 9.1 8.9 9.2   LFT Recent Labs    09/22/21 0453  PROT 6.8  ALBUMIN 3.8  AST 19  ALT 27  ALKPHOS 30*  BILITOT 0.8   DG Abd 1 View  Result Date: 09/21/2021 CLINICAL DATA:  Nausea and vomiting EXAM: ABDOMEN - 1 VIEW COMPARISON:  None.  FINDINGS: The bowel gas pattern is normal. No radio-opaque calculi or other significant radiographic abnormality are seen. IMPRESSION: Nonobstructive pattern of bowel gas. Electronically Signed   By: 09/24/21 M.D.   On: 09/21/2021 13:33    Assessment / Plan: # 20 yo female with chronic, intermittent nausea, vomiting and RLQ pain. Multiple ED visits for these symptoms. CTAP in September 2022 unrevealing. Pelvic 36 in September remarkable for right ovarian cyst. Her labs have been unremarkable overall. Etiology of recurrent symptoms unclear at this point. Reported weight loss of 20 pounds in last few months doesn't correlate with Epic weights.  Symptoms much improved on pantoprazole 40 mg daily and Zofran scheduled. --Cortisol level normal. --Urine porphyrin studies pending. --Needs to stop cannabis use. --If symptoms recurrent then consider starting TCA for ? Cyclic vomiting and functional abdominal pain. --EGD 10/19 with only a small hiatal hernia and distal esophageal erosions from vomiting.   # Small liver lesion on CTAP. Focal fat along the falciform ligament. There is a rounded hypodensity in the right lobe of the liver which is too small to characterize, possibly a small cyst or hemangioma.   # Lung nodule- 5 mm indeterminate LLL nodule on CT scan  **She is ok for discharge from GI standpoint as long as she tolerates diet.  Should continue pantoprazole 40 mg daily and can prescribe zofran 4 mg every 8 hours for  a week or so then should try to decrease use.   LOS: 2 days   Princella Pellegrini. Tumeka Chimenti  09/23/2021, 9:19 AM

## 2021-09-25 LAB — PORPHOBILINOGEN, RANDOM URINE: Quantitative Porphobilinogen: 0.7 mg/L (ref 0.0–2.0)

## 2021-10-24 ENCOUNTER — Encounter (HOSPITAL_BASED_OUTPATIENT_CLINIC_OR_DEPARTMENT_OTHER): Payer: Self-pay | Admitting: *Deleted

## 2021-10-24 ENCOUNTER — Emergency Department (HOSPITAL_BASED_OUTPATIENT_CLINIC_OR_DEPARTMENT_OTHER)
Admission: EM | Admit: 2021-10-24 | Discharge: 2021-10-25 | Disposition: A | Discharge status: Home / Self Care | Payer: Self-pay | Attending: Emergency Medicine | Admitting: Emergency Medicine

## 2021-10-24 ENCOUNTER — Other Ambulatory Visit: Payer: Self-pay

## 2021-10-24 ENCOUNTER — Emergency Department (HOSPITAL_BASED_OUTPATIENT_CLINIC_OR_DEPARTMENT_OTHER): Payer: Self-pay

## 2021-10-24 DIAGNOSIS — R102 Pelvic and perineal pain: Secondary | ICD-10-CM | POA: Insufficient documentation

## 2021-10-24 DIAGNOSIS — R110 Nausea: Secondary | ICD-10-CM | POA: Insufficient documentation

## 2021-10-24 DIAGNOSIS — N939 Abnormal uterine and vaginal bleeding, unspecified: Secondary | ICD-10-CM | POA: Insufficient documentation

## 2021-10-24 DIAGNOSIS — N83202 Unspecified ovarian cyst, left side: Secondary | ICD-10-CM

## 2021-10-24 DIAGNOSIS — E876 Hypokalemia: Secondary | ICD-10-CM

## 2021-10-24 DIAGNOSIS — R1031 Right lower quadrant pain: Secondary | ICD-10-CM

## 2021-10-24 LAB — URINALYSIS, ROUTINE W REFLEX MICROSCOPIC
Bilirubin Urine: NEGATIVE
Glucose, UA: NEGATIVE mg/dL
Ketones, ur: 15 mg/dL — AB
Leukocytes,Ua: NEGATIVE
Nitrite: NEGATIVE
Protein, ur: 100 mg/dL — AB
Specific Gravity, Urine: 1.039 — ABNORMAL HIGH (ref 1.005–1.030)
pH: 6 (ref 5.0–8.0)

## 2021-10-24 LAB — CBC WITH DIFFERENTIAL/PLATELET
Abs Immature Granulocytes: 0.03 10*3/uL (ref 0.00–0.07)
Basophils Absolute: 0 10*3/uL (ref 0.0–0.1)
Basophils Relative: 0 %
Eosinophils Absolute: 0 10*3/uL (ref 0.0–0.5)
Eosinophils Relative: 0 %
HCT: 42.5 % (ref 36.0–46.0)
Hemoglobin: 14.6 g/dL (ref 12.0–15.0)
Immature Granulocytes: 0 %
Lymphocytes Relative: 7 %
Lymphs Abs: 0.7 10*3/uL (ref 0.7–4.0)
MCH: 30.3 pg (ref 26.0–34.0)
MCHC: 34.4 g/dL (ref 30.0–36.0)
MCV: 88.2 fL (ref 80.0–100.0)
Monocytes Absolute: 0.4 10*3/uL (ref 0.1–1.0)
Monocytes Relative: 4 %
Neutro Abs: 8.6 10*3/uL — ABNORMAL HIGH (ref 1.7–7.7)
Neutrophils Relative %: 89 %
Platelets: 320 10*3/uL (ref 150–400)
RBC: 4.82 MIL/uL (ref 3.87–5.11)
RDW: 13.5 % (ref 11.5–15.5)
WBC: 9.7 10*3/uL (ref 4.0–10.5)
nRBC: 0 % (ref 0.0–0.2)

## 2021-10-24 LAB — COMPREHENSIVE METABOLIC PANEL
ALT: 13 U/L (ref 0–44)
AST: 16 U/L (ref 15–41)
Albumin: 5 g/dL (ref 3.5–5.0)
Alkaline Phosphatase: 36 U/L — ABNORMAL LOW (ref 38–126)
Anion gap: 14 (ref 5–15)
BUN: 13 mg/dL (ref 6–20)
CO2: 28 mmol/L (ref 22–32)
Calcium: 10.5 mg/dL — ABNORMAL HIGH (ref 8.9–10.3)
Chloride: 96 mmol/L — ABNORMAL LOW (ref 98–111)
Creatinine, Ser: 0.79 mg/dL (ref 0.44–1.00)
GFR, Estimated: >60 mL/min (ref >60)
Glucose, Bld: 146 mg/dL — ABNORMAL HIGH (ref 70–99)
Potassium: 2.8 mmol/L — ABNORMAL LOW (ref 3.5–5.1)
Sodium: 138 mmol/L (ref 135–145)
Total Bilirubin: 0.6 mg/dL (ref 0.3–1.2)
Total Protein: 8.6 g/dL — ABNORMAL HIGH (ref 6.5–8.1)

## 2021-10-24 LAB — WET PREP, GENITAL
Sperm: NONE SEEN
Trich, Wet Prep: NONE SEEN
WBC, Wet Prep HPF POC: <10 (ref <10)
Yeast Wet Prep HPF POC: NONE SEEN

## 2021-10-24 LAB — PREGNANCY, URINE: Preg Test, Ur: NEGATIVE

## 2021-10-24 LAB — LIPASE, BLOOD: Lipase: <10 U/L — ABNORMAL LOW (ref 11–51)

## 2021-10-24 MED ORDER — PROCHLORPERAZINE EDISYLATE 10 MG/2ML IJ SOLN
10.0000 mg | Freq: Once | INTRAMUSCULAR | Status: AC
  Administered 2021-10-24: 10 mg via INTRAVENOUS
  Filled 2021-10-24: qty 2

## 2021-10-24 MED ORDER — SODIUM CHLORIDE 0.9 % IV BOLUS
1000.0000 mL | Freq: Once | INTRAVENOUS | Status: AC
  Administered 2021-10-24: 1000 mL via INTRAVENOUS

## 2021-10-24 MED ORDER — POTASSIUM CHLORIDE 10 MEQ/100ML IV SOLN
10.0000 meq | Freq: Once | INTRAVENOUS | Status: AC
  Administered 2021-10-24: 10 meq via INTRAVENOUS
  Filled 2021-10-24: qty 100

## 2021-10-24 MED ORDER — POTASSIUM CHLORIDE CRYS ER 20 MEQ PO TBCR
40.0000 meq | EXTENDED_RELEASE_TABLET | Freq: Once | ORAL | Status: AC
  Administered 2021-10-24: 40 meq via ORAL
  Filled 2021-10-24: qty 2

## 2021-10-24 MED ORDER — HYDROMORPHONE HCL 1 MG/ML IJ SOLN
1.0000 mg | Freq: Once | INTRAMUSCULAR | Status: AC
  Administered 2021-10-24: 1 mg via INTRAVENOUS
  Filled 2021-10-24: qty 1

## 2021-10-24 MED ORDER — ONDANSETRON HCL 4 MG/2ML IJ SOLN
4.0000 mg | Freq: Once | INTRAMUSCULAR | Status: AC
  Administered 2021-10-24: 4 mg via INTRAVENOUS
  Filled 2021-10-24: qty 2

## 2021-10-24 NOTE — ED Notes (Signed)
Patient transported to Ultrasound 

## 2021-10-24 NOTE — ED Provider Notes (Signed)
[facility] EMERGENCY DEPT Provider Note   CSN: [id number] Arrival date & time: 10/24/21  2150     History Chief Complaint  Patient presents with   Abdominal Pain    Christina Mays is a 20 y.o. female.  The history is provided by the patient.  Abdominal Pain Pain location:  RLQ and suprapubic Pain quality: aching   Pain radiates to:  Does not radiate Pain severity:  Severe Onset quality:  Gradual Duration:  3 days Timing:  Constant Progression:  Unchanged Chronicity:  Recurrent Context comment:  Pain in her right lower abdomen, cyst in the past. Currently on menstrual cycle. Relieved by:  Nothing Worsened by:  Nothing Associated symptoms: nausea and vaginal bleeding (on menstrual cycle)   Associated symptoms: no chest pain, no chills, no cough, no dysuria, no fever, no hematuria, no shortness of breath, no sore throat and no vomiting       Past Medical History:  Diagnosis Date   Migraine with aura    Ovarian cyst     Patient Active Problem List   Diagnosis Date Noted   Right lower quadrant abdominal pain    Hypokalemia 09/20/2021   Intractable nausea and vomiting 09/20/2021   Migraine with aura     Past Surgical History:  Procedure Laterality Date   BIOPSY  09/22/2021   Procedure: BIOPSY;  Surgeon: Yetta Flock, MD;  Location: [facility];  Service: Gastroenterology;;   ESOPHAGOGASTRODUODENOSCOPY (EGD) WITH PROPOFOL N/A 09/22/2021   Procedure: ESOPHAGOGASTRODUODENOSCOPY (EGD) WITH PROPOFOL;  Surgeon: Yetta Flock, MD;  Location: [facility];  Service: Gastroenterology;  Laterality: N/A;   MOUTH SURGERY     UMBILICAL HERNIA REPAIR       OB History     Gravida  0   Para  0   Term  0   Preterm  0   AB  0   Living  0      SAB  0   IAB  0   Ectopic  0   Multiple  0   Live Births  0           Family History  Family history unknown: Yes    Social History   Tobacco Use   Smoking status: Never    Smokeless tobacco: Never  Vaping Use   Vaping Use: Never used  Substance Use Topics   Alcohol use: No   Drug use: Yes    Types: Marijuana    Home Medications Prior to Admission medications   Medication Sig Start Date End Date Taking? Authorizing Provider  acetaminophen (TYLENOL) 500 MG tablet Take 1 tablet (500 mg total) by mouth every 6 (six) hours as needed for mild pain or moderate pain. 09/23/21   Hongalgi, Lenis Dickinson, MD  ondansetron (ZOFRAN ODT) 4 MG disintegrating tablet Take 1 tablet (4 mg total) by mouth 3 (three) times daily. Take 1 tab (4 mg total) 3 times daily for 4 to 5 days, then take 1 tab (4 mg total) 2 or 3 times daily only as needed for nausea or vomiting. 09/23/21   Hongalgi, Lenis Dickinson, MD  pantoprazole (PROTONIX) 40 MG tablet Take 1 tablet (40 mg total) by mouth daily. 09/23/21   Hongalgi, Lenis Dickinson, MD    Allergies    Pepto-bismol [bismuth subsalicylate]  Review of Systems   Review of Systems  Constitutional:  Negative for chills and fever.  HENT:  Negative for ear pain and sore throat.   Eyes:  Negative for pain  and visual disturbance.  Respiratory:  Negative for cough and shortness of breath.   Cardiovascular:  Negative for chest pain and palpitations.  Gastrointestinal:  Positive for abdominal pain and nausea. Negative for vomiting.  Genitourinary:  Positive for pelvic pain and vaginal bleeding (on menstrual cycle). Negative for dysuria and hematuria.  Musculoskeletal:  Negative for arthralgias and back pain.  Skin:  Negative for color change and rash.  Neurological:  Negative for seizures and syncope.  All other systems reviewed and are negative.  Physical Exam Updated Vital Signs BP 135/88   Pulse 85   Temp 98.4 F (36.9 C) (Oral)   Resp 18   Ht 5' (1.524 m)   Wt 43.5 kg   LMP 10/23/2021   SpO2 98%   BMI 18.75 kg/m   Physical Exam Vitals and nursing note reviewed.  Constitutional:      General: She is not in acute distress.    Appearance: She is  well-developed.  HENT:     Head: Normocephalic and atraumatic.  Eyes:     Conjunctiva/sclera: Conjunctivae normal.  Cardiovascular:     Rate and Rhythm: Normal rate and regular rhythm.     Heart sounds: Normal heart sounds. No murmur heard. Pulmonary:     Effort: Pulmonary effort is normal. No respiratory distress.     Breath sounds: Normal breath sounds.  Abdominal:     General: Abdomen is flat.     Palpations: Abdomen is soft.     Tenderness: There is abdominal tenderness in the right lower quadrant. There is guarding.  Genitourinary:    Vagina: Bleeding present.     Cervix: No cervical motion tenderness or discharge.     Uterus: Normal.      Adnexa: Left adnexa normal.       Right: Tenderness present.        Left: No tenderness.    Musculoskeletal:        General: No swelling.     Cervical back: Neck supple.  Skin:    General: Skin is warm and dry.     Capillary Refill: Capillary refill takes less than 2 seconds.  Neurological:     Mental Status: She is alert.  Psychiatric:        Mood and Affect: Mood normal.    ED Results / Procedures / Treatments   Labs (all labs ordered are listed, but only abnormal results are displayed) Labs Reviewed  CBC WITH DIFFERENTIAL/PLATELET - Abnormal; Notable for the following components:      Result Value   Neutro Abs 8.6 (*)    All other components within normal limits  WET PREP, GENITAL  COMPREHENSIVE METABOLIC PANEL  LIPASE, BLOOD  URINALYSIS, ROUTINE W REFLEX MICROSCOPIC  PREGNANCY, URINE  GC/CHLAMYDIA PROBE AMP ([place]) NOT AT [facility]    EKG None  Radiology No results found.  Procedures Procedures   Medications Ordered in ED Medications  HYDROmorphone (DILAUDID) injection 1 mg (1 mg Intravenous Given 10/24/21 2214)  ondansetron (ZOFRAN) injection 4 mg (4 mg Intravenous Given 10/24/21 2214)  sodium chloride 0.9 % bolus 1,000 mL (1,000 mLs Intravenous New Bag/Given 10/24/21 2210)    ED Course  I have reviewed  the triage vital signs and the nursing notes.  Pertinent labs & imaging results that were available during my care of the patient were reviewed by me and considered in my medical decision making (see chart for details).    MDM Rules/Calculators/A&P  Christina Mays is here with lower abdominal pain.  History of ovarian cyst on the right side.  On her menstrual cycle.  Normal vitals.  No fever.  Pain for the last 3 days.  Mostly in the right lower quadrant at her prior ovarian cyst site.  She denies any history of abdominal surgery.  Still has her appendix.  Patient very uncomfortable on exam.  She is tender in the right lower quadrant/pelvic area.  GU exam is overall unremarkable.  Has some tenderness in the right adnexal area.  She has some scant bleeding.  No cervical motion tenderness.  She denies any concern for STDs.  Seems that this is pelvic in origin.  Possibly could be the appendix.  However she has no fever or white count.  We will get an ultrasound to evaluate for cyst versus torsion.  Lab work and imaging are pending at time of handoff to oncoming ED staff.  Please see their note for further results, evaluation, disposition of the patient.  If ultrasound is unremarkable will likely need a CT scan to evaluate for the appendix.  This chart was dictated using voice recognition software.  Despite best efforts to proofread,  errors can occur which can change the documentation meaning.   Final Clinical Impression(s) / ED Diagnoses Final diagnoses:  Pelvic pain    Rx / DC Orders ED Discharge Orders     None        Virgina Norfolk, DO 10/24/21 2250

## 2021-10-24 NOTE — ED Triage Notes (Signed)
Pt reports right lower abdominal pain started about 2 days ago, hx of ovarian cyst. Also having vomiting. Denies fevers. First day LMP Saturday.

## 2021-10-24 NOTE — ED Notes (Signed)
Patient transported to US 

## 2021-10-25 MED ORDER — POTASSIUM CHLORIDE CRYS ER 20 MEQ PO TBCR: 40.0000 meq | EXTENDED_RELEASE_TABLET | Freq: Every day | ORAL | 0 refills | Status: DC

## 2021-10-25 MED ORDER — NAPROXEN 500 MG PO TABS: 500.0000 mg | ORAL_TABLET | Freq: Two times a day (BID) | ORAL | 0 refills | Status: DC

## 2021-10-25 NOTE — Discharge Instructions (Addendum)
You were seen today for abdominal pain.  You have an ovarian cyst.  You also had notably low potassium.  Take potassium for the next week.  Follow-up with your primary physician for recheck of your potassium.  You should also follow-up with your OB/GYN for any ongoing abdominal discomfort.  Take naproxen as needed for pain.

## 2021-10-25 NOTE — ED Provider Notes (Signed)
Patient signed out pending labs and ultrasound.  Labs reviewed and notable for potassium of 2.8.  This was replaced both orally and IV.  Ultrasound is notable for a hemorrhagic ovarian cyst.  This is the likely culprit of the patient's abdominal discomfort.  On recheck, she states she feels much better.  She is able to tolerate fluids.  Will discharge with naproxen and potassium.  Recommend recheck for primary physician in 1 week regarding her potassium.  Physical Exam  BP 119/65   Pulse 95   Temp 98.4 F (36.9 C) (Oral)   Resp 18   Ht 1.524 m (5')   Wt 43.5 kg   LMP 10/22/2021   SpO2 100%   BMI 18.75 kg/m   Physical Exam  ED Course/Procedures   Clinical Course as of 10/25/21 0049  Sun Oct 24, 2021  2359 Patient states she feels much better.  She does have evidence of an ovarian cyst.  She is currently getting potassium replacement.  Will p.o. challenge.  Anticipate discharge. [CH]    Clinical Course User Index [CH] Mersedes Alber, Mayer Masker, MD    Procedures  MDM   Problem List Items Addressed This Visit       Other   Hypokalemia   Other Visit Diagnoses     Pelvic pain    -  Primary   Abdominal pain, RLQ (right lower quadrant)       Relevant Orders   US PELVIC COMPLETE W TRANSVAGINAL AND TORSION R/O (Completed)   Cyst of left ovary                 Shon Baton, MD 10/25/21 [phone]

## 2021-10-26 ENCOUNTER — Other Ambulatory Visit: Payer: Self-pay

## 2021-10-26 ENCOUNTER — Emergency Department (HOSPITAL_BASED_OUTPATIENT_CLINIC_OR_DEPARTMENT_OTHER): Payer: Self-pay

## 2021-10-26 ENCOUNTER — Emergency Department (HOSPITAL_BASED_OUTPATIENT_CLINIC_OR_DEPARTMENT_OTHER)
Admission: EM | Admit: 2021-10-26 | Discharge: 2021-10-26 | Disposition: A | Discharge status: Home / Self Care | Payer: Self-pay | Attending: Emergency Medicine | Admitting: Emergency Medicine

## 2021-10-26 ENCOUNTER — Encounter (HOSPITAL_BASED_OUTPATIENT_CLINIC_OR_DEPARTMENT_OTHER): Payer: Self-pay | Admitting: Emergency Medicine

## 2021-10-26 DIAGNOSIS — R112 Nausea with vomiting, unspecified: Secondary | ICD-10-CM | POA: Insufficient documentation

## 2021-10-26 DIAGNOSIS — R1031 Right lower quadrant pain: Secondary | ICD-10-CM

## 2021-10-26 DIAGNOSIS — E876 Hypokalemia: Secondary | ICD-10-CM

## 2021-10-26 LAB — COMPREHENSIVE METABOLIC PANEL
ALT: 12 U/L (ref 0–44)
AST: 17 U/L (ref 15–41)
Albumin: 4.6 g/dL (ref 3.5–5.0)
Alkaline Phosphatase: 36 U/L — ABNORMAL LOW (ref 38–126)
Anion gap: 12 (ref 5–15)
BUN: 12 mg/dL (ref 6–20)
CO2: 25 mmol/L (ref 22–32)
Calcium: 10.2 mg/dL (ref 8.9–10.3)
Chloride: 102 mmol/L (ref 98–111)
Creatinine, Ser: 0.68 mg/dL (ref 0.44–1.00)
GFR, Estimated: >60 mL/min (ref >60)
Glucose, Bld: 104 mg/dL — ABNORMAL HIGH (ref 70–99)
Potassium: 2.7 mmol/L — CL (ref 3.5–5.1)
Sodium: 139 mmol/L (ref 135–145)
Total Bilirubin: 0.6 mg/dL (ref 0.3–1.2)
Total Protein: 7.6 g/dL (ref 6.5–8.1)

## 2021-10-26 LAB — CBC
HCT: 39.9 % (ref 36.0–46.0)
Hemoglobin: 13.2 g/dL (ref 12.0–15.0)
MCH: 29.4 pg (ref 26.0–34.0)
MCHC: 33.1 g/dL (ref 30.0–36.0)
MCV: 88.9 fL (ref 80.0–100.0)
Platelets: 278 10*3/uL (ref 150–400)
RBC: 4.49 MIL/uL (ref 3.87–5.11)
RDW: 13.2 % (ref 11.5–15.5)
WBC: 9.6 10*3/uL (ref 4.0–10.5)
nRBC: 0 % (ref 0.0–0.2)

## 2021-10-26 LAB — GC/CHLAMYDIA PROBE AMP (~~LOC~~) NOT AT ARMC
Chlamydia: NEGATIVE
Comment: NEGATIVE
Comment: NORMAL
Neisseria Gonorrhea: NEGATIVE

## 2021-10-26 LAB — LIPASE, BLOOD: Lipase: 15 U/L (ref 11–51)

## 2021-10-26 LAB — MAGNESIUM: Magnesium: 1.9 mg/dL (ref 1.7–2.4)

## 2021-10-26 MED ORDER — SODIUM CHLORIDE 0.9 % IV BOLUS
1000.0000 mL | Freq: Once | INTRAVENOUS | Status: AC
  Administered 2021-10-26: 1000 mL via INTRAVENOUS

## 2021-10-26 MED ORDER — POTASSIUM CHLORIDE 10 MEQ/100ML IV SOLN
10.0000 meq | INTRAVENOUS | Status: AC
  Administered 2021-10-26 (×2): 10 meq via INTRAVENOUS
  Filled 2021-10-26: qty 100

## 2021-10-26 MED ORDER — ONDANSETRON HCL 4 MG PO TABS: 4.0000 mg | ORAL_TABLET | Freq: Four times a day (QID) | ORAL | 0 refills | Status: DC

## 2021-10-26 MED ORDER — POTASSIUM CHLORIDE CRYS ER 20 MEQ PO TBCR: 40.0000 meq | EXTENDED_RELEASE_TABLET | Freq: Two times a day (BID) | ORAL | 1 refills | Status: AC

## 2021-10-26 MED ORDER — POTASSIUM CHLORIDE CRYS ER 20 MEQ PO TBCR: 40.0000 meq | EXTENDED_RELEASE_TABLET | Freq: Every day | ORAL | 0 refills | Status: AC

## 2021-10-26 MED ORDER — MORPHINE SULFATE (PF) 4 MG/ML IV SOLN
4.0000 mg | Freq: Once | INTRAVENOUS | Status: AC
  Administered 2021-10-26: 4 mg via INTRAVENOUS
  Filled 2021-10-26: qty 1

## 2021-10-26 MED ORDER — HALOPERIDOL LACTATE 5 MG/ML IJ SOLN
5.0000 mg | Freq: Once | INTRAMUSCULAR | Status: AC
  Administered 2021-10-26: 5 mg via INTRAVENOUS
  Filled 2021-10-26: qty 1

## 2021-10-26 MED ORDER — NAPROXEN 500 MG PO TABS: 500.0000 mg | ORAL_TABLET | Freq: Two times a day (BID) | ORAL | 0 refills | Status: AC

## 2021-10-26 MED ORDER — ONDANSETRON HCL 4 MG/2ML IJ SOLN
4.0000 mg | Freq: Once | INTRAMUSCULAR | Status: AC
  Administered 2021-10-26: 4 mg via INTRAVENOUS
  Filled 2021-10-26: qty 2

## 2021-10-26 MED ORDER — IOHEXOL 300 MG/ML  SOLN
50.0000 mL | Freq: Once | INTRAMUSCULAR | Status: AC | PRN
  Administered 2021-10-26: 50 mL via INTRAVENOUS

## 2021-10-26 NOTE — ED Provider Notes (Signed)
[facility] EMERGENCY DEPT Provider Note   CSN: [id number] Arrival date & time: 10/26/21  1414     History Chief Complaint  Patient presents with   Emesis   Abdominal Pain    Christina Mays is a 20 y.o. female.  HPI  Patient with recently diagnosed right hemorrhagic cyst presents with right lower quadrant abdominal pain.  This started for 5 days ago, she was seen 2 days ago in the ED.  She is having severe right-sided abdominal pain which is associated with nausea and vomiting.  Is become constant, she has not been able to take any of the prescribed medicine because of cost.  No prior abdominal surgeries.  Past Medical History:  Diagnosis Date   Migraine with aura    Ovarian cyst     Patient Active Problem List   Diagnosis Date Noted   Right lower quadrant abdominal pain    Hypokalemia 09/20/2021   Intractable nausea and vomiting 09/20/2021   Migraine with aura     Past Surgical History:  Procedure Laterality Date   BIOPSY  09/22/2021   Procedure: BIOPSY;  Surgeon: Benancio Deeds, MD;  Location: [facility];  Service: Gastroenterology;;   ESOPHAGOGASTRODUODENOSCOPY (EGD) WITH PROPOFOL N/A 09/22/2021   Procedure: ESOPHAGOGASTRODUODENOSCOPY (EGD) WITH PROPOFOL;  Surgeon: Benancio Deeds, MD;  Location: [facility];  Service: Gastroenterology;  Laterality: N/A;   MOUTH SURGERY     UMBILICAL HERNIA REPAIR       OB History     Gravida  0   Para  0   Term  0   Preterm  0   AB  0   Living  0      SAB  0   IAB  0   Ectopic  0   Multiple  0   Live Births  0           Family History  Family history unknown: Yes    Social History   Tobacco Use   Smoking status: Never   Smokeless tobacco: Never  Vaping Use   Vaping Use: Never used  Substance Use Topics   Alcohol use: No   Drug use: Yes    Types: Marijuana    Home Medications Prior to Admission medications   Medication Sig Start Date End Date Taking?  Authorizing Provider  acetaminophen (TYLENOL) 500 MG tablet Take 1 tablet (500 mg total) by mouth every 6 (six) hours as needed for mild pain or moderate pain. 09/23/21   Hongalgi, Maximino Greenland, MD  naproxen (NAPROSYN) 500 MG tablet Take 1 tablet (500 mg total) by mouth 2 (two) times daily. 10/25/21   Horton, Mayer Masker, MD  ondansetron (ZOFRAN ODT) 4 MG disintegrating tablet Take 1 tablet (4 mg total) by mouth 3 (three) times daily. Take 1 tab (4 mg total) 3 times daily for 4 to 5 days, then take 1 tab (4 mg total) 2 or 3 times daily only as needed for nausea or vomiting. 09/23/21   Hongalgi, Maximino Greenland, MD  pantoprazole (PROTONIX) 40 MG tablet Take 1 tablet (40 mg total) by mouth daily. 09/23/21   Hongalgi, Maximino Greenland, MD  potassium chloride SA (KLOR-CON) 20 MEQ tablet Take 2 tablets (40 mEq total) by mouth daily. 10/25/21   Horton, Mayer Masker, MD    Allergies    Pepto-bismol [bismuth subsalicylate]  Review of Systems   Review of Systems  Constitutional:  Negative for fever.  Respiratory:  Negative for shortness of breath.   Cardiovascular:  Negative for chest pain.  Gastrointestinal:  Positive for abdominal pain, nausea and vomiting.  Genitourinary:  Negative for dysuria.  Musculoskeletal:  Negative for back pain.   Physical Exam Updated Vital Signs BP (!) 146/94 (BP Location: Right Arm)   Pulse 83   Temp 98.7 F (37.1 C) (Oral)   Resp (!) 22   LMP 10/22/2021   SpO2 100%   Physical Exam Vitals and nursing note reviewed. Exam conducted with a chaperone present.  Constitutional:      Appearance: Normal appearance.  HENT:     Head: Normocephalic and atraumatic.  Eyes:     General: No scleral icterus.       Right eye: No discharge.        Left eye: No discharge.     Extraocular Movements: Extraocular movements intact.     Pupils: Pupils are equal, round, and reactive to light.  Cardiovascular:     Rate and Rhythm: Normal rate and regular rhythm.     Pulses: Normal pulses.     Heart  sounds: Normal heart sounds. No murmur heard.   No friction rub. No gallop.  Pulmonary:     Effort: Pulmonary effort is normal. No respiratory distress.     Breath sounds: Normal breath sounds.  Abdominal:     General: Abdomen is flat. Bowel sounds are normal. There is no distension.     Palpations: Abdomen is soft.     Tenderness: There is generalized abdominal tenderness and tenderness in the right lower quadrant. There is guarding. There is no rebound.  Skin:    General: Skin is warm and dry.     Coloration: Skin is not jaundiced.  Neurological:     Mental Status: She is alert. Mental status is at baseline.     Coordination: Coordination normal.   ED Results / Procedures / Treatments   Labs (all labs ordered are listed, but only abnormal results are displayed) Labs Reviewed  LIPASE, BLOOD  COMPREHENSIVE METABOLIC PANEL  CBC  URINALYSIS, ROUTINE W REFLEX MICROSCOPIC  PREGNANCY, URINE    EKG None  Radiology US PELVIC COMPLETE W TRANSVAGINAL AND TORSION R/O  Result Date: 10/24/2021 CLINICAL DATA:  Right lower quadrant abdominal pain. Recurrent ovarian cyst. Currently menstruating EXAM: TRANSABDOMINAL AND TRANSVAGINAL ULTRASOUND OF PELVIS DOPPLER ULTRASOUND OF OVARIES TECHNIQUE: Both transabdominal and transvaginal ultrasound examinations of the pelvis were performed. Transabdominal technique was performed for global imaging of the pelvis including uterus, ovaries, adnexal regions, and pelvic cul-de-sac. It was necessary to proceed with endovaginal exam following the transabdominal exam to visualize the ovaries bilaterally. Color and duplex Doppler ultrasound was utilized to evaluate blood flow to the ovaries. COMPARISON:  08/21/2021 FINDINGS: Uterus Measurements: 8.1 x 3.2 x 5.2 cm = volume: 70 mL. No fibroids or other mass visualized. Cervix unremarkable. Endometrium Thickness: 4 mm.  No focal abnormality visualized. Right ovary Measurements: 4.4 x 2.6 x 2.4 cm = volume: 15 mL.  Normal appearance/no adnexal mass. Left ovary Measurements: 4.4 x 3.1 x 4.3 cm = volume: 31 mL. A complex cystic lesion is seen within the left ovary, new since prior examination demonstrating no internal vascularity and echogenic mural soft tissue demonstrating a scalloped margin, in keeping with a hemorrhagic cyst. This measures 3.8 x 3.8 x 3.6 cm. Pulsed Doppler evaluation of both ovaries demonstrates normal low-resistance arterial and venous waveforms. Other findings Trace simple appearing free fluid is seen within the pelvis. IMPRESSION: 3.8 cm left ovarian hemorrhagic cyst. Otherwise unremarkable pelvic sonogram. Electronically Signed  By: Helyn Numbers M.D.   On: 10/24/2021 23:46    Procedures Procedures   Medications Ordered in ED Medications  sodium chloride 0.9 % bolus 1,000 mL (has no administration in time range)  ondansetron (ZOFRAN) injection 4 mg (has no administration in time range)  morphine 4 MG/ML injection 4 mg (has no administration in time range)    ED Course  I have reviewed the triage vital signs and the nursing notes.  Pertinent labs & imaging results that were available during my care of the patient were reviewed by me and considered in my medical decision making (see chart for details).    MDM Rules/Calculators/A&P                           Stable vitals, pulse rate is elevated but not technically tachycardic.  Voluntary guarding diffusely on abdominal exam, specific to the right lower quadrant.  She was seen 2 days ago, ultrasound was done at that time which was notable for hemorrhagic ovarian cyst.  CT scan was deferred.  Patient is still having worsening pain, given that CT was done that time we will proceed as cannot rule out appendicitis.  She is having consistent emesis episodes, this could be contributable to cannabis hyperemesis.  She is hypokalemic likely secondary to all the emesis, IV potassium supplementation started.  CT abdomen without any findings  of appendicitis.  Suspect that the right lower quadrant pain is due to the ovarian cysts.  IV potassium given, patient passed p.o. challenge.  Discussed option of admitting for IV supplementation versus outpatient trial with some nausea medicine and pain medicine.  I did speak to social work, able to get her medicines covered.  Patient would prefer outpatient trial, I think this is reasonable.  Patient discharged in stable condition.  Final Clinical Impression(s) / ED Diagnoses Final diagnoses:  None    Rx / DC Orders ED Discharge Orders     None        Theron Arista, PA-C 10/26/21 2053    Virgina Norfolk, DO 10/26/21 2055

## 2021-10-26 NOTE — ED Triage Notes (Signed)
Pt arrives to ED with c/o of emesis. This started x3 days ago and has been constant. Associated symptoms include nausea. She also reports RLQ abdominal pain that has been intermittent for months. Has been seen for same in past.

## 2021-10-26 NOTE — Discharge Instructions (Signed)
Take the antinausea medicine every 6 hours as needed. For abdominal pain try taking Tylenol as needed.  I prescribed you naproxen, you can take this twice daily or every 12 hours.  Take it with food and water as this reduces the chance of developing ulcers, and anti-inflammatory medicine taken without food and water increases the risk of irritation of the GI lining. Take 40 mg of the potassium twice daily for the next week.  Follow-up with a primary care doctor in a week to make sure your potassium levels have increased. If you are unable to keep any of the medicine down or if symptoms worsen you need to return back to the ED.

## 2021-10-26 NOTE — Discharge Planning (Signed)
  [facility] Medication Assistance Card Name: Christina Mays ID (MRN): [id number] Bin: [id number] RX Group: [id number] Discharge Date: 10/26/2021 Expiration Date:11/03/2021                                           (must be filled within 7 days of discharge)     You have been approved to have the prescriptions written by your discharging physician filled through our [facility] (Medication Assistance Through [facility]) program. This program allows for a one-time (no refills) 34-day supply of selected medications for a low copay amount.  The copay is $0  Only certain pharmacies are participating in this program with [facility]. You will need to select one of the pharmacies from the attached list and take your prescriptions, this letter, and your photo ID to one of the [facility] pharmacies, [facility], [facility] at [address], or [facility] [address].   We are excited that you are able to use the [facility] program to get your medications. These prescriptions must be filled within 7 days of hospital discharge or they will no longer be valid for the [facility] program. Should you have any problems with your prescriptions please contact your case management team member at [phone] for [place]/ [facility].  Thank you, [facility] Health Care Management

## 2021-11-08 ENCOUNTER — Ambulatory Visit: Payer: Self-pay | Admitting: Gastroenterology

## 2022-04-13 ENCOUNTER — Emergency Department (HOSPITAL_COMMUNITY)
Admission: EM | Admit: 2022-04-13 | Discharge: 2022-04-13 | Discharge status: Against Medical Advice | Payer: Self-pay | Attending: Emergency Medicine | Admitting: Emergency Medicine

## 2022-04-13 ENCOUNTER — Encounter (HOSPITAL_BASED_OUTPATIENT_CLINIC_OR_DEPARTMENT_OTHER): Payer: Self-pay | Admitting: Emergency Medicine

## 2022-04-13 ENCOUNTER — Other Ambulatory Visit: Payer: Self-pay

## 2022-04-13 ENCOUNTER — Emergency Department (HOSPITAL_BASED_OUTPATIENT_CLINIC_OR_DEPARTMENT_OTHER)
Admission: EM | Admit: 2022-04-13 | Discharge: 2022-04-13 | Disposition: A | Discharge status: Home / Self Care | Payer: Self-pay | Attending: Emergency Medicine | Admitting: Emergency Medicine

## 2022-04-13 ENCOUNTER — Encounter (HOSPITAL_COMMUNITY): Payer: Self-pay

## 2022-04-13 DIAGNOSIS — Z5321 Procedure and treatment not carried out due to patient leaving prior to being seen by health care provider: Secondary | ICD-10-CM | POA: Insufficient documentation

## 2022-04-13 DIAGNOSIS — R110 Nausea: Secondary | ICD-10-CM

## 2022-04-13 DIAGNOSIS — E876 Hypokalemia: Secondary | ICD-10-CM | POA: Insufficient documentation

## 2022-04-13 DIAGNOSIS — R112 Nausea with vomiting, unspecified: Secondary | ICD-10-CM | POA: Insufficient documentation

## 2022-04-13 DIAGNOSIS — R809 Proteinuria, unspecified: Secondary | ICD-10-CM | POA: Insufficient documentation

## 2022-04-13 DIAGNOSIS — R824 Acetonuria: Secondary | ICD-10-CM | POA: Insufficient documentation

## 2022-04-13 DIAGNOSIS — R109 Unspecified abdominal pain: Secondary | ICD-10-CM | POA: Insufficient documentation

## 2022-04-13 DIAGNOSIS — R1031 Right lower quadrant pain: Secondary | ICD-10-CM | POA: Insufficient documentation

## 2022-04-13 LAB — COMPREHENSIVE METABOLIC PANEL
ALT: 7 U/L (ref 0–44)
AST: 10 U/L — ABNORMAL LOW (ref 15–41)
Albumin: 4.3 g/dL (ref 3.5–5.0)
Alkaline Phosphatase: 35 U/L — ABNORMAL LOW (ref 38–126)
Anion gap: 9 (ref 5–15)
BUN: 8 mg/dL (ref 6–20)
CO2: 25 mmol/L (ref 22–32)
Calcium: 9.4 mg/dL (ref 8.9–10.3)
Chloride: 105 mmol/L (ref 98–111)
Creatinine, Ser: 0.55 mg/dL (ref 0.44–1.00)
GFR, Estimated: >60 mL/min (ref >60)
Glucose, Bld: 95 mg/dL (ref 70–99)
Potassium: 3 mmol/L — ABNORMAL LOW (ref 3.5–5.1)
Sodium: 139 mmol/L (ref 135–145)
Total Bilirubin: 0.4 mg/dL (ref 0.3–1.2)
Total Protein: 6.7 g/dL (ref 6.5–8.1)

## 2022-04-13 LAB — CBC
HCT: 35.9 % — ABNORMAL LOW (ref 36.0–46.0)
Hemoglobin: 12.2 g/dL (ref 12.0–15.0)
MCH: 29.6 pg (ref 26.0–34.0)
MCHC: 34 g/dL (ref 30.0–36.0)
MCV: 87.1 fL (ref 80.0–100.0)
Platelets: 282 10*3/uL (ref 150–400)
RBC: 4.12 MIL/uL (ref 3.87–5.11)
RDW: 13.7 % (ref 11.5–15.5)
WBC: 6.7 10*3/uL (ref 4.0–10.5)
nRBC: 0 % (ref 0.0–0.2)

## 2022-04-13 LAB — URINALYSIS, ROUTINE W REFLEX MICROSCOPIC
Bilirubin Urine: NEGATIVE
Glucose, UA: NEGATIVE mg/dL
Hgb urine dipstick: NEGATIVE
Ketones, ur: 15 mg/dL — AB
Leukocytes,Ua: NEGATIVE
Nitrite: NEGATIVE
Protein, ur: 100 mg/dL — AB
Specific Gravity, Urine: 1.034 — ABNORMAL HIGH (ref 1.005–1.030)
pH: 6.5 (ref 5.0–8.0)

## 2022-04-13 LAB — LIPASE, BLOOD: Lipase: 15 U/L (ref 11–51)

## 2022-04-13 LAB — PREGNANCY, URINE: Preg Test, Ur: NEGATIVE

## 2022-04-13 MED ORDER — SODIUM CHLORIDE 0.9 % IV BOLUS
1000.0000 mL | Freq: Once | INTRAVENOUS | Status: AC
Start: 2022-04-13 — End: 2022-04-13
  Administered 2022-04-13: 1000 mL via INTRAVENOUS

## 2022-04-13 MED ORDER — ONDANSETRON HCL 4 MG PO TABS: 4.0000 mg | ORAL_TABLET | Freq: Four times a day (QID) | ORAL | 0 refills | Status: DC

## 2022-04-13 MED ORDER — ONDANSETRON HCL 4 MG/2ML IJ SOLN
4.0000 mg | Freq: Once | INTRAMUSCULAR | Status: AC
Start: 2022-04-13 — End: 2022-04-13
  Administered 2022-04-13: 4 mg via INTRAVENOUS
  Filled 2022-04-13: qty 2

## 2022-04-13 MED ORDER — POTASSIUM CHLORIDE CRYS ER 20 MEQ PO TBCR
40.0000 meq | EXTENDED_RELEASE_TABLET | Freq: Once | ORAL | Status: AC
  Administered 2022-04-13: 40 meq via ORAL
  Filled 2022-04-13: qty 2

## 2022-04-13 MED ORDER — MORPHINE SULFATE (PF) 2 MG/ML IV SOLN
2.0000 mg | Freq: Once | INTRAVENOUS | Status: AC
  Administered 2022-04-13: 2 mg via INTRAVENOUS
  Filled 2022-04-13: qty 1

## 2022-04-13 NOTE — ED Triage Notes (Signed)
Pt has hx of ovarian cyst. She has abdominal pain and vomiting for 3 days. She is not sure if cyst or stomach virus.  ?

## 2022-04-13 NOTE — Discharge Instructions (Addendum)
Please use Tylenol or ibuprofen for pain.  You may use 600 mg ibuprofen every 6 hours or 1000 mg of Tylenol every 6 hours.  You may choose to alternate between the 2.  This would be most effective.  Not to exceed 4 g of Tylenol within 24 hours.  Not to exceed 3200 mg ibuprofen 24 hours. ? ?You can use Zofran as needed for nausea.  If your abdominal pain significantly worsens or you have fever that does not respond to ibuprofen Tylenol I recommend that you return to the emergency department for further evaluation.  It was a pleasure taking care of you today. ?

## 2022-04-13 NOTE — ED Provider Triage Note (Signed)
Emergency Medicine Provider Triage Evaluation Note ? ?Christina Mays , a 21 y.o. female  was evaluated in triage.  Pt complains of nausea and vomiting ? ?Review of Systems  ?Positive: Abdominal cramping ?Negative: fever ? ?Physical Exam  ?BP (!) 151/82 (BP Location: Left Arm)   Pulse 86   Temp 97.7 ?F (36.5 ?C) (Oral)   Resp 17   LMP 03/05/2022 (Approximate)   SpO2 97%  ?Gen:   Awake, no distress   ?Resp:  Normal effort  ?MSK:   Moves extremities without difficulty  ?Other:   ? ?Medical Decision Making  ?Medically screening exam initiated at 10:51 AM.  Appropriate orders placed.  Christina Mays was informed that the remainder of the evaluation will be completed by another provider, this initial triage assessment does not replace that evaluation, and the importance of remaining in the ED until their evaluation is complete. ? ? ?  ?Elson Areas, [place] ?04/13/22 1103 ? ?

## 2022-04-13 NOTE — ED Notes (Signed)
Pt sent to restroom for urine sample.

## 2022-04-13 NOTE — ED Notes (Signed)
Pt verbalizes understanding of discharge instructions. Opportunity for questioning and answers were provided. Pt discharged from ED to home.   ? ?

## 2022-04-13 NOTE — ED Triage Notes (Signed)
Pt presents with c/o nausea/vomiting for 3 days. Pt reports she was recently exposed to her nephew who had a stomach bug. ?

## 2022-04-13 NOTE — ED Notes (Signed)
Pt has not been present in the lobby for over an hour. ?

## 2022-04-13 NOTE — ED Provider Notes (Signed)
?[facility] EMERGENCY DEPT ?Provider Note ? ? ?CSN: [id number] ?Arrival date & time: 04/13/22  1829 ? ?  ? ?History ? ?Chief Complaint  ?Patient presents with  ? Abdominal Pain  ? ? ?Christina Mays is a 21 y.o. female with a past medical history significant for known ovarian cysts, previous intractable nausea and vomiting who presents with concern for abdominal pain, vomiting for the last 3 days.  Patient reports that she is also having some crampy abdominal pain.  She reports some abdominal pain in the right lower quadrant which is sometimes consistent with her ovarian cyst.  She reports that she does still have her gallbladder, appendix.  She denies fever, chills reports that she does feel like she gets pretty hot when she is vomiting.  Endorses history of low iron, low potassium with nausea and vomiting in the past.  Patient denies any chest pain, constipation, diarrhea, shortness of breath. ? ? ?Abdominal Pain ?Associated symptoms: nausea   ? ?  ? ?Home Medications ?Prior to Admission medications   ?Medication Sig Start Date End Date Taking? Authorizing Provider  ?ondansetron (ZOFRAN) 4 MG tablet Take 1 tablet (4 mg total) by mouth every 6 (six) hours. 04/13/22  Yes Ardell Makarewicz H, PA-C  ?acetaminophen (TYLENOL) 500 MG tablet Take 1 tablet (500 mg total) by mouth every 6 (six) hours as needed for mild pain or moderate pain. 09/23/21   Hongalgi, Maximino GreenlandAnand D, MD  ?naproxen (NAPROSYN) 500 MG tablet Take 1 tablet (500 mg total) by mouth 2 (two) times daily. 10/26/21   Theron AristaSage, Haley, PA-C  ?ondansetron (ZOFRAN ODT) 4 MG disintegrating tablet Take 1 tablet (4 mg total) by mouth 3 (three) times daily. Take 1 tab (4 mg total) 3 times daily for 4 to 5 days, then take 1 tab (4 mg total) 2 or 3 times daily only as needed for nausea or vomiting. 09/23/21   Hongalgi, Maximino GreenlandAnand D, MD  ?pantoprazole (PROTONIX) 40 MG tablet Take 1 tablet (40 mg total) by mouth daily. 09/23/21   Hongalgi, Maximino GreenlandAnand D, MD  ?potassium chloride  SA (KLOR-CON) 20 MEQ tablet Take 2 tablets (40 mEq total) by mouth 2 (two) times daily. 10/26/21   Theron AristaSage, Haley, PA-C  ?potassium chloride SA (KLOR-CON) 20 MEQ tablet Take 2 tablets (40 mEq total) by mouth daily. 10/26/21   Theron AristaSage, Haley, PA-C  ?   ? ?Allergies    ?Pepto-bismol [bismuth subsalicylate]   ? ?Review of Systems   ?Review of Systems  ?Gastrointestinal:  Positive for abdominal pain and nausea.  ?All other systems reviewed and are negative. ? ?Physical Exam ?Updated Vital Signs ?BP 112/68   Pulse 65   Temp 98.4 ?F (36.9 ?C) (Oral)   Resp 18   Ht 5' (1.524 m)   Wt 44.9 kg   SpO2 99%   BMI 19.33 kg/m?  ?Physical Exam ?Vitals and nursing note reviewed.  ?Constitutional:   ?   General: She is not in acute distress. ?   Appearance: Normal appearance.  ?HENT:  ?   Head: Normocephalic and atraumatic.  ?Eyes:  ?   General:     ?   Right eye: No discharge.     ?   Left eye: No discharge.  ?Cardiovascular:  ?   Rate and Rhythm: Normal rate and regular rhythm.  ?   Heart sounds: No murmur heard. ?  No friction rub. No gallop.  ?Pulmonary:  ?   Effort: Pulmonary effort is normal.  ?   Breath sounds:  Normal breath sounds.  ?Abdominal:  ?   General: Bowel sounds are normal.  ?   Palpations: Abdomen is soft.  ?   Comments: Some tenderness with palpation throughout the abdomen, most focally in the right lower quadrant, not significantly located over McBurney's point.  No rebound, rigidity, guarding.  Normal bowel sounds throughout.  ?Skin: ?   General: Skin is warm and dry.  ?   Capillary Refill: Capillary refill takes less than 2 seconds.  ?Neurological:  ?   Mental Status: She is alert and oriented to person, place, and time.  ?Psychiatric:     ?   Mood and Affect: Mood normal.     ?   Behavior: Behavior normal.  ? ? ?ED Results / Procedures / Treatments   ?Labs ?(all labs ordered are listed, but only abnormal results are displayed) ?Labs Reviewed  ?CBC - Abnormal; Notable for the following components:  ?    Result  Value  ? HCT 35.9 (*)   ? All other components within normal limits  ?COMPREHENSIVE METABOLIC PANEL - Abnormal; Notable for the following components:  ? Potassium 3.0 (*)   ? AST 10 (*)   ? Alkaline Phosphatase 35 (*)   ? All other components within normal limits  ?URINALYSIS, ROUTINE W REFLEX MICROSCOPIC - Abnormal; Notable for the following components:  ? APPearance HAZY (*)   ? Specific Gravity, Urine 1.034 (*)   ? Ketones, ur 15 (*)   ? Protein, ur 100 (*)   ? All other components within normal limits  ?LIPASE, BLOOD  ?PREGNANCY, URINE  ? ? ?EKG ?None ? ?Radiology ?No results found. ? ?Procedures ?Procedures  ? ? ?Medications Ordered in ED ?Medications  ?sodium chloride 0.9 % bolus 1,000 mL (0 mLs Intravenous Stopped 04/13/22 2040)  ?ondansetron (ZOFRAN) injection 4 mg (4 mg Intravenous Given 04/13/22 1941)  ?morphine (PF) 2 MG/ML injection 2 mg (2 mg Intravenous Given 04/13/22 1941)  ?potassium chloride SA (KLOR-CON M) CR tablet 40 mEq (40 mEq Oral Given 04/13/22 2039)  ? ? ?ED Course/ Medical Decision Making/ A&P ?  ?                        ?Medical Decision Making ?Amount and/or Complexity of Data Reviewed ?Labs: ordered. ? ?Risk ?Prescription drug management. ? ? ?This patient is a 21 y.o. female who presents to the ED for concern of abdominal pain, nausea, vomiting, this involves an extensive number of treatment options, and is a complaint that carries with it a high risk of complications and morbidity. The emergent differential diagnosis prior to evaluation includes, but is not limited to, acute appendicitis, ovarian torsion, although neither of these as likely as pain has not been persistent, patient with no rebound, rigidity, guarding on abdominal exam, also considered gastroenteritis, enteritis, colitis, right-sided diverticulitis, pain secondary to known ovarian cysts, UTI, nephrolithiasis, pyelonephritis versus other.  ? ?This is not an exhaustive differential.  ? ?Past Medical History / Co-morbidities /  Social History: ?Past medical history of previous ovarian cyst, pain secondary to ovarian cyst ? ?Additional history: ?Chart reviewed. Pertinent results include: Reviewed lab work, imaging from previous emergency department visits including negative CT abdomen pelvis as recently as November of last year. ? ?Physical Exam: ?Physical exam performed. The pertinent findings include: Some tenderness to palpation throughout the abdomen, most focally in the right lower quadrant, however no rebound, rigidity, guarding, tenderness does not seem to be focally located over McBurney's point. ? ?  Lab Tests: ?I ordered, and personally interpreted labs.  The pertinent results include: CBC is unremarkable at this time.  CMP shows hypokalemia, potassium 3.0 we will orally replete.  No other significant abnormalities of CMP.  Urinalysis with high specific gravity, ketones, protein suggestive of dehydration.  Urine pregnancy test is negative. ?  ?Imaging Studies: ?Discussion on transfer for ultrasound versus performing CT abdomen pelvis given patient's location of right lower quadrant pain, however she has no leukocytosis, pain is overall resolved with pain medication x1, and patient has viable alternative diagnosis given known history of ovarian cysts, as well as recent contact with other family member with stomach virus I have low clinical suspicion for acute appendicitis at this time.  Discussed extensive return precautions. ?  ?Medications: ?I ordered medication including fluid bolus, potassium chloride, morphine, Zofran pain, nausea, hypokalemia, dehydration. Reevaluation of the patient after these medicines showed that the patient improved. I have reviewed the patients home medicines and have made adjustments as needed.  Discharged with prescription for Zofran for any persistent nausea. ? ?Disposition: ?After consideration of the diagnostic results and the patients response to treatment, I feel that patient's clinical syndrome is  consistent with possible right lower quadrant pain secondary to previous ovarian cyst.  With no leukocytosis, pain significantly improved after morphine x1 and not persistent on physical exam I have low clin

## 2022-06-26 IMAGING — CT CT ABD-PELV W/ CM
2 of 4 series · 16 of 46 positions shown, 18 images · IV contrast (omnipaque)
Comparison: CT examination dated August 18, 2021. Pelvic
sonogram dated October 24, 2021.

CLINICAL DATA: Right lower quadrant abdominal pain, appendicitis
suspected.

EXAM:
CT ABDOMEN AND PELVIS WITH CONTRAST
TECHNIQUE: Multidetector CT imaging of the abdomen and pelvis was performed
using the standard protocol following bolus administration of
intravenous contrast.
CONTRAST:  50mL OMNIPAQUE IOHEXOL 300 MG/ML  SOLN

[Series 2: abd pel w · axial · 0.65mm/px · z∈[-377,-7]mm · 13 of 82 slices shown, 15 images]
[im 4/82  soft-tissue]
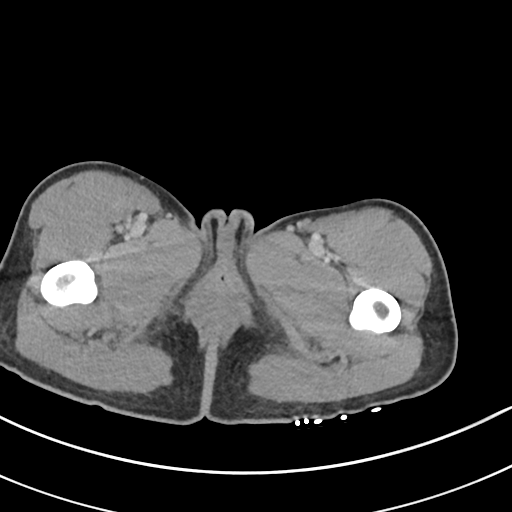
[im 4/82  bone]
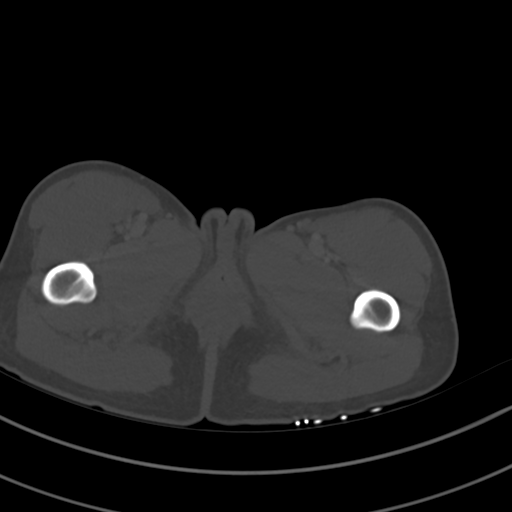
[im 10/82  soft-tissue]
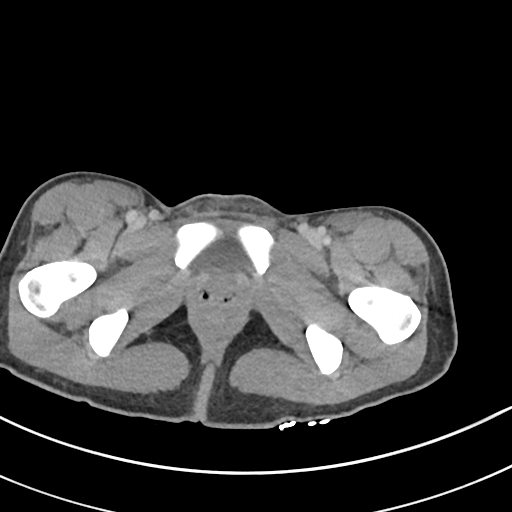
[im 17/82  soft-tissue]
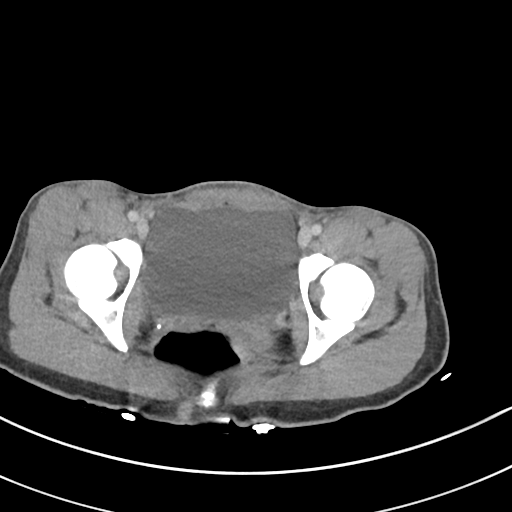
[im 23/82  soft-tissue]
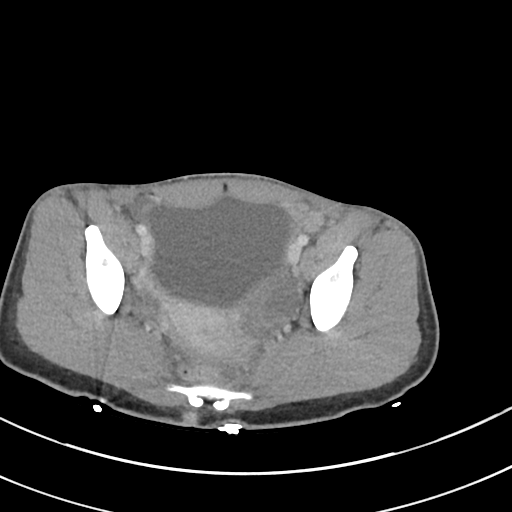
[im 30/82  soft-tissue]
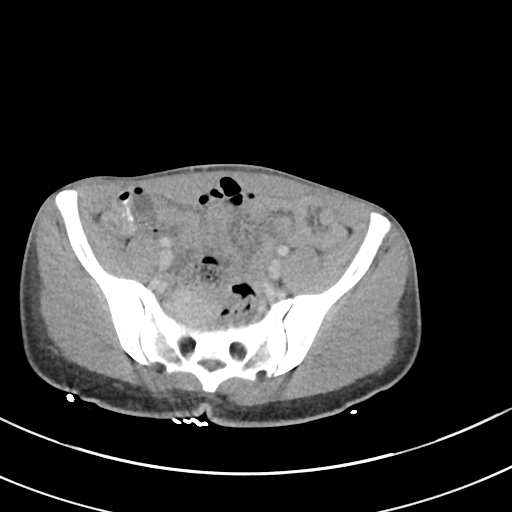
[im 36/82  soft-tissue]
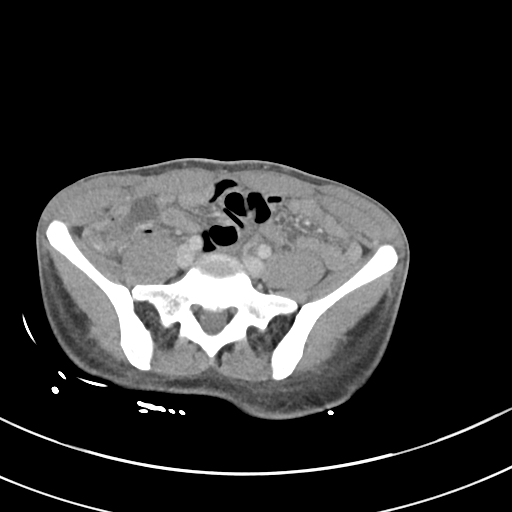
[im 43/82  soft-tissue]
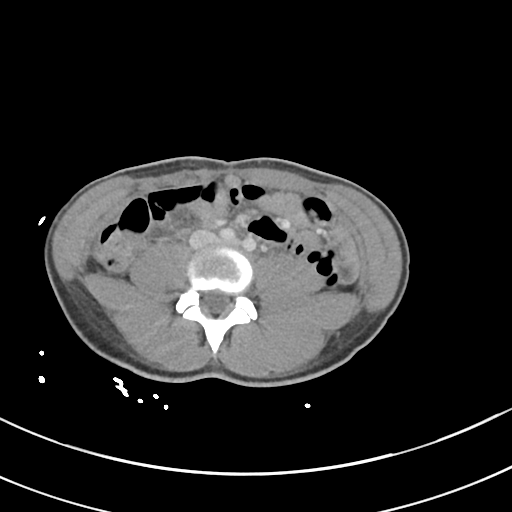
[im 46/82  soft-tissue]
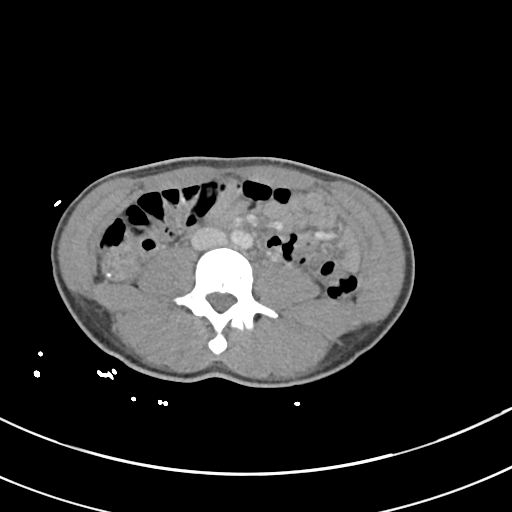
[im 52/82  soft-tissue]
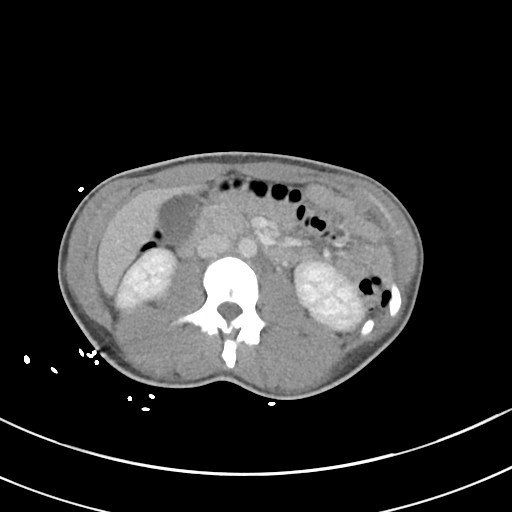
[im 52/82  bone]
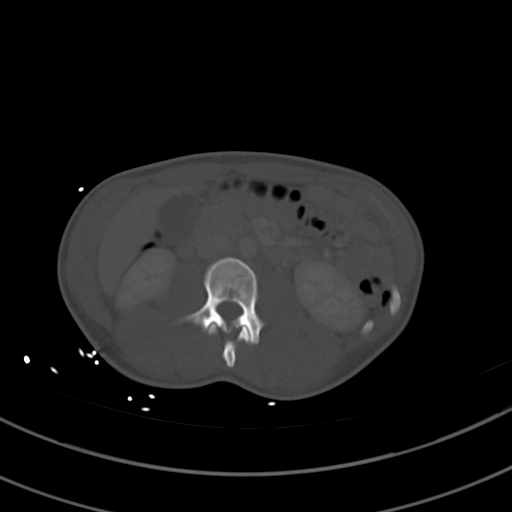
[im 59/82  soft-tissue]
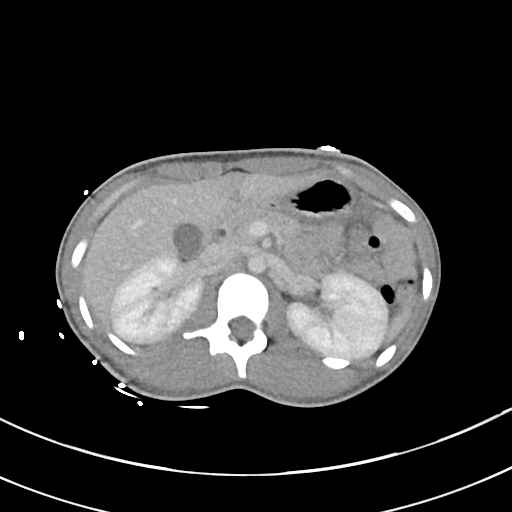
[im 65/82  soft-tissue]
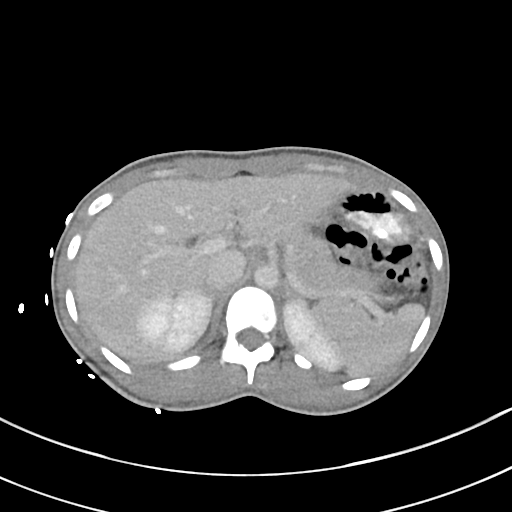
[im 72/82  soft-tissue]
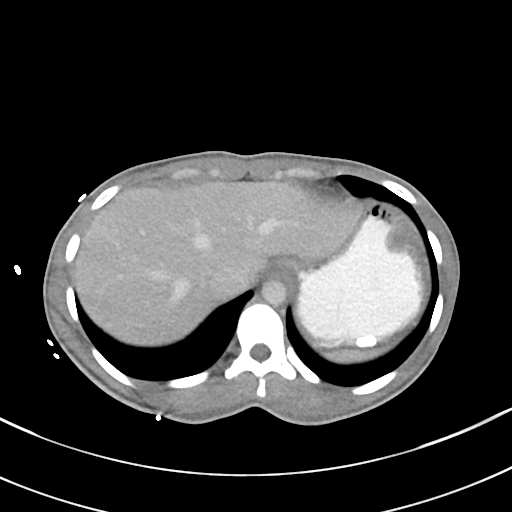
[im 78/82  soft-tissue]
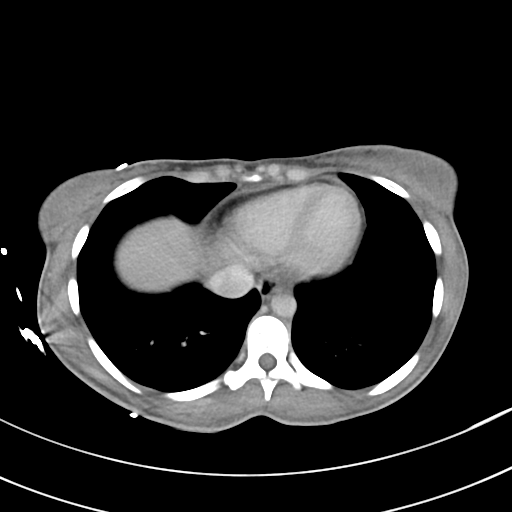

[Series 5: coronal · coronal · 0.64mm/px · 3 of 65 slices shown]
[im 22/65  soft-tissue]
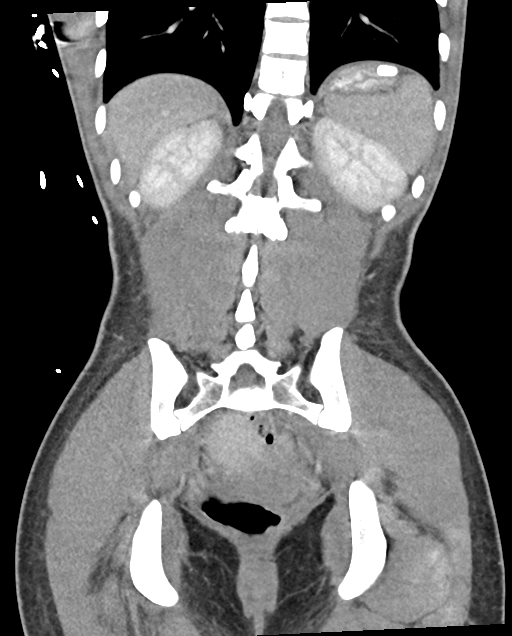
[im 29/65  soft-tissue]
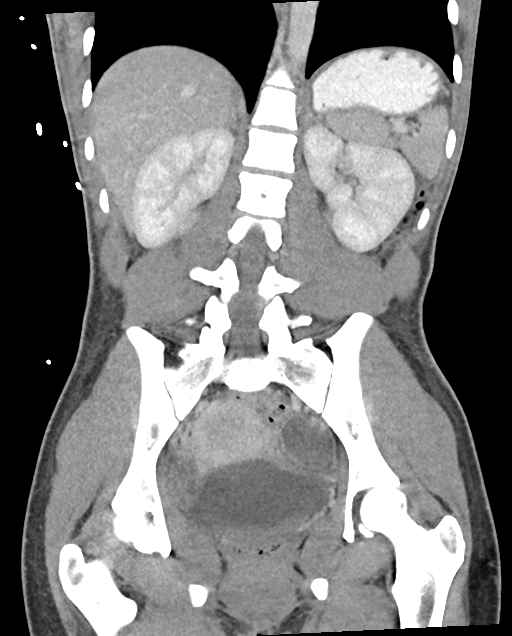
[im 36/65  soft-tissue]
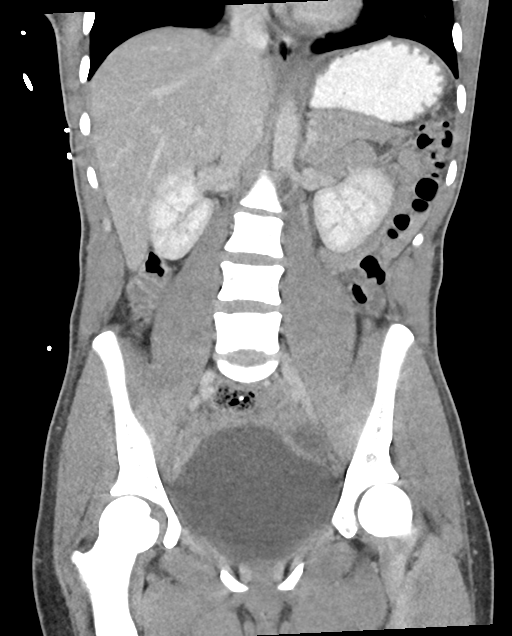

[16 of 46 positions shown; findings below may reference images not displayed]

FINDINGS: Lower chest: No acute abnormality. Previously noted left lower lobe
5 mm nodule was not included on this examination

Hepatobiliary: No focal liver abnormality is seen. No gallstones,
gallbladder wall thickening, or biliary dilatation.

Pancreas: Unremarkable. No pancreatic ductal dilatation or
surrounding inflammatory changes.

Spleen: Normal in size without focal abnormality.

Adrenals/Urinary Tract: Adrenal glands are unremarkable. Kidneys are
normal, without renal calculi, focal lesion, or hydronephrosis.
Bladder is unremarkable.

Stomach/Bowel: Stomach is within normal limits. Appendix appears
normal. No evidence of bowel wall thickening, distention, or
inflammatory changes.

Vascular/Lymphatic: No significant vascular findings are present. No
enlarged abdominal or pelvic lymph nodes.

Reproductive: Uterus is normal in size. Left adnexal complex cyst
measuring 2.8 x 3.6 cm, which likely represents a hemorrhagic cyst,
as seen on prior ultrasound examination.

Other: No abdominal wall hernia or abnormality. No abdominopelvic
ascites.

Musculoskeletal: No acute or significant osseous findings.
IMPRESSION: 1.  Normal appendix.

2. Left ovarian hemorrhagic cyst measuring up to 3.6 cm, similar to
prior sonogram.

3.  No CT evidence of acute abdominal/pelvic process.

## 2022-12-19 ENCOUNTER — Emergency Department (HOSPITAL_BASED_OUTPATIENT_CLINIC_OR_DEPARTMENT_OTHER)
Admission: EM | Admit: 2022-12-19 | Discharge: 2022-12-19 | Disposition: A | Discharge status: Home / Self Care | Payer: Medicaid Other | Attending: Emergency Medicine | Admitting: Emergency Medicine

## 2022-12-19 ENCOUNTER — Emergency Department (HOSPITAL_BASED_OUTPATIENT_CLINIC_OR_DEPARTMENT_OTHER): Payer: Medicaid Other

## 2022-12-19 ENCOUNTER — Encounter (HOSPITAL_BASED_OUTPATIENT_CLINIC_OR_DEPARTMENT_OTHER): Payer: Self-pay | Admitting: Emergency Medicine

## 2022-12-19 ENCOUNTER — Other Ambulatory Visit: Payer: Self-pay

## 2022-12-19 DIAGNOSIS — R112 Nausea with vomiting, unspecified: Secondary | ICD-10-CM | POA: Insufficient documentation

## 2022-12-19 DIAGNOSIS — R1032 Left lower quadrant pain: Secondary | ICD-10-CM | POA: Diagnosis present

## 2022-12-19 DIAGNOSIS — N83202 Unspecified ovarian cyst, left side: Secondary | ICD-10-CM | POA: Diagnosis not present

## 2022-12-19 LAB — LIPASE, BLOOD: Lipase: <10 U/L — ABNORMAL LOW (ref 11–51)

## 2022-12-19 LAB — CBC
HCT: 38 % (ref 36.0–46.0)
Hemoglobin: 13 g/dL (ref 12.0–15.0)
MCH: 30 pg (ref 26.0–34.0)
MCHC: 34.2 g/dL (ref 30.0–36.0)
MCV: 87.8 fL (ref 80.0–100.0)
Platelets: 316 10*3/uL (ref 150–400)
RBC: 4.33 MIL/uL (ref 3.87–5.11)
RDW: 12.9 % (ref 11.5–15.5)
WBC: 9.8 10*3/uL (ref 4.0–10.5)
nRBC: 0 % (ref 0.0–0.2)

## 2022-12-19 LAB — COMPREHENSIVE METABOLIC PANEL
ALT: 10 U/L (ref 0–44)
AST: 15 U/L (ref 15–41)
Albumin: 4.6 g/dL (ref 3.5–5.0)
Alkaline Phosphatase: 36 U/L — ABNORMAL LOW (ref 38–126)
Anion gap: 9 (ref 5–15)
BUN: 9 mg/dL (ref 6–20)
CO2: 26 mmol/L (ref 22–32)
Calcium: 9.8 mg/dL (ref 8.9–10.3)
Chloride: 104 mmol/L (ref 98–111)
Creatinine, Ser: 0.66 mg/dL (ref 0.44–1.00)
GFR, Estimated: >60 mL/min (ref >60)
Glucose, Bld: 95 mg/dL (ref 70–99)
Potassium: 3.8 mmol/L (ref 3.5–5.1)
Sodium: 139 mmol/L (ref 135–145)
Total Bilirubin: 0.5 mg/dL (ref 0.3–1.2)
Total Protein: 7.6 g/dL (ref 6.5–8.1)

## 2022-12-19 LAB — URINALYSIS, ROUTINE W REFLEX MICROSCOPIC
Bacteria, UA: NONE SEEN
Bilirubin Urine: NEGATIVE
Glucose, UA: NEGATIVE mg/dL
Ketones, ur: NEGATIVE mg/dL
Leukocytes,Ua: NEGATIVE
Nitrite: NEGATIVE
RBC / HPF: >50 RBC/hpf — ABNORMAL HIGH (ref 0–5)
Specific Gravity, Urine: 1.021 (ref 1.005–1.030)
pH: 8 (ref 5.0–8.0)

## 2022-12-19 LAB — PREGNANCY, URINE: Preg Test, Ur: NEGATIVE

## 2022-12-19 MED ORDER — ONDANSETRON HCL 4 MG PO TABS: 4.0000 mg | ORAL_TABLET | Freq: Four times a day (QID) | ORAL | 0 refills | Status: AC | PRN

## 2022-12-19 MED ORDER — ONDANSETRON HCL 4 MG/2ML IJ SOLN
4.0000 mg | Freq: Once | INTRAMUSCULAR | Status: AC
  Administered 2022-12-19: 4 mg via INTRAVENOUS
  Filled 2022-12-19: qty 2

## 2022-12-19 MED ORDER — FENTANYL CITRATE PF 50 MCG/ML IJ SOSY
50.0000 ug | PREFILLED_SYRINGE | INTRAMUSCULAR | Status: DC | PRN
  Administered 2022-12-19: 50 ug via INTRAVENOUS
  Filled 2022-12-19: qty 1

## 2022-12-19 NOTE — ED Triage Notes (Signed)
Severe lower abdominal pain started this morning. Hx of ovarian cyst, "I think I have a cyst" N/v + Appears uncomfortable in triage

## 2022-12-19 NOTE — Discharge Instructions (Addendum)
It was a pleasure taking care of you today!   Your labs didn't show any concerning findings today. Your ultrasound today showed concern for a hemorrhagic cyst, smaller than your most recent ultrasound. You will be sent a prescription for Zofran, take as directed.  Attached is information for an OB-GYN specialist, call tomorrow and set up a follow up appointment regarding todays ED visit.  It is important that you follow-up for repeat ultrasound with your OB/GYN specialist in 6-12 weeks.  Return to the emergency department if you experience increasing/worsening symptoms.

## 2022-12-19 NOTE — ED Provider Notes (Signed)
[facility] EMERGENCY DEPT Provider Note   CSN: [id number] Arrival date & time: 12/19/22  1558     History  Chief Complaint  Patient presents with   Abdominal Pain    Christina Mays is a 22 y.o. female who presents to the ED with concerns for left lower abdominal pain onset 5 AM. Notes that her abdominal pain radiates from the left side to the right lower side. History of similar. Has associated nausea and vomiting. No meds tried PTA. Denies fever, urinary symptoms, vaginal discharge. Started her menstrual cycle today.    Per patient chart review: Patient was evaluated in the [facility] emergency department on 12/08/2021 for similar symptoms.  At that time had ultrasound that showed a known left hemorrhagic cyst as well as concerns for an ovarian mass that was favored to be an endometrioma on the wet read.  Recommendations for repeat ultrasound in 12 months.  The history is provided by the patient. No language interpreter was used.       Home Medications Prior to Admission medications   Medication Sig Start Date End Date Taking? Authorizing Provider  acetaminophen (TYLENOL) 500 MG tablet Take 1 tablet (500 mg total) by mouth every 6 (six) hours as needed for mild pain or moderate pain. 09/23/21   Hongalgi, Lenis Dickinson, MD  naproxen (NAPROSYN) 500 MG tablet Take 1 tablet (500 mg total) by mouth 2 (two) times daily. 10/26/21   Sherrill Raring, PA-C  ondansetron (ZOFRAN ODT) 4 MG disintegrating tablet Take 1 tablet (4 mg total) by mouth 3 (three) times daily. Take 1 tab (4 mg total) 3 times daily for 4 to 5 days, then take 1 tab (4 mg total) 2 or 3 times daily only as needed for nausea or vomiting. 09/23/21   Hongalgi, Lenis Dickinson, MD  ondansetron (ZOFRAN) 4 MG tablet Take 1 tablet (4 mg total) by mouth every 6 (six) hours as needed for nausea or vomiting. 12/19/22   Ravina Milner A, PA-C  pantoprazole (PROTONIX) 40 MG tablet Take 1 tablet (40 mg total) by mouth daily. 09/23/21   Hongalgi, Lenis Dickinson, MD  potassium chloride SA (KLOR-CON) 20 MEQ tablet Take 2 tablets (40 mEq total) by mouth 2 (two) times daily. 10/26/21   Sherrill Raring, PA-C  potassium chloride SA (KLOR-CON) 20 MEQ tablet Take 2 tablets (40 mEq total) by mouth daily. 10/26/21   Sherrill Raring, PA-C      Allergies    Pepto-bismol [bismuth subsalicylate]    Review of Systems   Review of Systems  Constitutional:  Negative for fever.  Gastrointestinal:  Positive for abdominal pain, nausea and vomiting.  Genitourinary:  Negative for dysuria, hematuria, vaginal bleeding and vaginal discharge.  All other systems reviewed and are negative.   Physical Exam Updated Vital Signs BP 136/80 (BP Location: Right Arm)   Pulse 77   Temp 97.8 F (36.6 C) (Oral)   Resp 20   LMP 12/19/2022   SpO2 100%  Physical Exam Vitals and nursing note reviewed. Exam conducted with a chaperone present.  Constitutional:      General: She is not in acute distress.    Appearance: Normal appearance. She is not ill-appearing, toxic-appearing or diaphoretic.  HENT:     Head: Normocephalic and atraumatic.     Right Ear: External ear normal.     Left Ear: External ear normal.     Mouth/Throat:     Pharynx: No oropharyngeal exudate.  Eyes:     General: No scleral icterus.  Extraocular Movements: Extraocular movements intact.     Conjunctiva/sclera: Conjunctivae normal.  Cardiovascular:     Rate and Rhythm: Normal rate and regular rhythm.     Pulses: Normal pulses.     Heart sounds: Normal heart sounds.  Pulmonary:     Effort: Pulmonary effort is normal. No respiratory distress.     Breath sounds: Normal breath sounds. No wheezing.  Abdominal:     General: Abdomen is flat. Bowel sounds are normal. There is no distension.     Palpations: Abdomen is soft. There is no mass.     Tenderness: There is no abdominal tenderness. There is no guarding or rebound.     Hernia: There is no hernia in the left inguinal area or right inguinal area.   Genitourinary:    Pubic Area: No rash.      Labia:        Right: No rash, tenderness, lesion or injury.        Left: No rash, tenderness, lesion or injury.      Vagina: No signs of injury and foreign body. Bleeding present. No vaginal discharge, erythema or tenderness.     Cervix: Normal.     Uterus: Normal. Not deviated, not enlarged, not fixed and not tender.      Adnexa: Right adnexa normal and left adnexa normal.     Comments: RN chaperone present for exam.  Vaginal bleeding noted on exam. Musculoskeletal:        General: Normal range of motion.     Cervical back: Normal range of motion and neck supple.  Lymphadenopathy:     Lower Body: No right inguinal adenopathy. No left inguinal adenopathy.  Skin:    General: Skin is warm and dry.  Neurological:     Mental Status: She is alert.  Psychiatric:        Behavior: Behavior normal.     ED Results / Procedures / Treatments   Labs (all labs ordered are listed, but only abnormal results are displayed) Labs Reviewed  LIPASE, BLOOD - Abnormal; Notable for the following components:      Result Value   Lipase <10 (*)    All other components within normal limits  COMPREHENSIVE METABOLIC PANEL - Abnormal; Notable for the following components:   Alkaline Phosphatase 36 (*)    All other components within normal limits  URINALYSIS, ROUTINE W REFLEX MICROSCOPIC - Abnormal; Notable for the following components:   APPearance CLOUDY (*)    Hgb urine dipstick LARGE (*)    Protein, ur TRACE (*)    RBC / HPF >50 (*)    All other components within normal limits  CBC  PREGNANCY, URINE    EKG None  Radiology US PELVIC COMPLETE W TRANSVAGINAL AND TORSION R/O  Result Date: 12/19/2022 CLINICAL DATA:  Pelvic pain, lower abdominal pain left-greater-than-right EXAM: TRANSABDOMINAL AND TRANSVAGINAL ULTRASOUND OF PELVIS DOPPLER ULTRASOUND OF OVARIES TECHNIQUE: Both transabdominal and transvaginal ultrasound examinations of the pelvis were  performed. Transabdominal technique was performed for global imaging of the pelvis including uterus, ovaries, adnexal regions, and pelvic cul-de-sac. It was necessary to proceed with endovaginal exam following the transabdominal exam to visualize the ovaries. Color and duplex Doppler ultrasound was utilized to evaluate blood flow to the ovaries. COMPARISON:  Ultrasound 10/24/2021.  CT 10/26/2021 FINDINGS: Uterus Measurements: 8.3 x 4.4 x 4.5 = volume: 85.9 mL. Hypoechoic area seen posterior along the midbody of the uterus measuring 9 x 5 x 7 mm, small fibroid Endometrium Thickness:  3.  No focal abnormality visualized. Right ovary Measurements: 3.1 x 2.3 x 2.1 = volume: 7.8 mL. Normal appearance/no adnexal mass. Left ovary Measurements: 4.8 x 2.9 x 3.2 = volume: 23.3 mL. There is a complex heterogeneous lesion involving the left ovary which is cystic measuring 3.2 x 2.4 x 1.7 cm. Previously there was a complex lesion in this location measuring 3.8 x 3.8 x 3.6 cm. Pulsed Doppler evaluation of both ovaries demonstrates normal low-resistance arterial and venous waveforms. Other findings No abnormal free fluid. IMPRESSION: 3.2 x 2.4 x 1.7 cm complex cystic lesion in the left ovary. Possibly a hemorrhagic cyst. Previously there was a slightly larger lesion. This could be smaller same lesion versus a different lesion that has evolved. Based on appearance recommend follow-up in 6-12 weeks. No significant free fluid. Small uterine fibroid Electronically Signed   By: Jill Side M.D.   On: 12/19/2022 17:57    Procedures Procedures    Medications Ordered in ED Medications  ondansetron (ZOFRAN) injection 4 mg (4 mg Intravenous Given 12/19/22 1649)    ED Course/ Medical Decision Making/ A&P Clinical Course as of 12/19/22 2210  Mon Dec 19, 2022  1803 Patient reevaluated and noted improvement of symptoms in ED.  [SB]  1925 Consult to OB-GYN specialist, Dr. Rip Harbour who recommends patient to call the office for  follow-up appointment. [SB]    Clinical Course User Index [SB] Gavina Dildine A, PA-C                             Medical Decision Making Amount and/or Complexity of Data Reviewed Labs: ordered. Radiology: ordered.  Risk Prescription drug management.   Patient presents to the emergency department with left lower abdominal pain onset 5 AM this morning.  History of similar symptoms.  No meds tried prior to arrival.  On exam patient with RN chaperone present for exam.  Vaginal bleeding noted on exam. Pt afebrile. Differential diagnosis includes diverticulitis ovarian cyst, ovarian torsion, acute cystitis.  Additional history obtained:  External records from outside source obtained and reviewed including: Patient was evaluated in the [facility] emergency department on 12/08/2021 for similar symptoms. At that time had ultrasound that showed a known left hemorrhagic cyst as well as concerns for an ovarian mass that was favored to be an endometrioma on the wet read. Recommendations for repeat ultrasound in 12 months.   Labs:  I ordered, and personally interpreted labs.  The pertinent results include:  Lipase, CBC, CMP unremarkable. Negative pregnancy urine. Large amount of hemoglobin noted on urinalysis otherwise nitrate and leukocyte negative (patient is currently on her menstrual cycle)  Imaging: I ordered imaging studies including ultrasound pelvic complete with transvaginal and torsion rule out I independently visualized and interpreted imaging which showed  3.2 x 2.4 x 1.7 cm complex cystic lesion in the left ovary. Possibly  a hemorrhagic cyst. Previously there was a slightly larger lesion.  This could be smaller same lesion versus a different lesion that has  evolved. Based on appearance recommend follow-up in 6-12 weeks. No  significant free fluid.    Small uterine fibroid   I agree with the radiologist interpretation  Medications:  I ordered medication including fentanyl, Zofran for  symptom management. Reevaluation of the patient after these medicines and interventions, I reevaluated the patient and found that they have improved I have reviewed the patients home medicines and have made adjustments as needed   Consultations: I requested consultation  with the OB-GYN, Dr.  Alysia Penna, and discussed lab and imaging findings as well as pertinent plan - they recommend: Call the office for follow-up   Disposition: Presentation to specialist for hemorrhagic cyst of left ovary, improved from previous ultrasound findings slightly.  Doubt at this time concerns for diverticulitis, acute cystitis, ovarian torsion.. After consideration of the diagnostic results and the patients response to treatment, I feel that the patient would benefit from Discharge home.  Patient will be discharged home with a prescription for Zofran.  Patient provided information for on-call OB/GYN specialist for follow-up.  Supportive care measures and strict return precautions discussed with patient at bedside. Pt acknowledges and verbalizes understanding. Pt appears safe for discharge. Follow up as indicated in discharge paperwork.   This chart was dictated using voice recognition software, Dragon. Despite the best efforts of this provider to proofread and correct errors, errors may still occur which can change documentation meaning.  Final Clinical Impression(s) / ED Diagnoses Final diagnoses:  Hemorrhagic cyst of left ovary    Rx / DC Orders ED Discharge Orders          Ordered    ondansetron (ZOFRAN) 4 MG tablet  Every 6 hours PRN        12/19/22 1945              Abdi Husak A, PA-C 12/19/22 2212    Franne Forts, DO 12/20/22 2358

## 2023-04-27 ENCOUNTER — Emergency Department (HOSPITAL_BASED_OUTPATIENT_CLINIC_OR_DEPARTMENT_OTHER)
Admission: EM | Admit: 2023-04-27 | Discharge: 2023-04-27 | Disposition: A | Discharge status: Home / Self Care | Payer: Medicaid Other | Attending: Emergency Medicine | Admitting: Emergency Medicine

## 2023-04-27 ENCOUNTER — Encounter (HOSPITAL_BASED_OUTPATIENT_CLINIC_OR_DEPARTMENT_OTHER): Payer: Self-pay | Admitting: Emergency Medicine

## 2023-04-27 ENCOUNTER — Other Ambulatory Visit: Payer: Self-pay

## 2023-04-27 ENCOUNTER — Emergency Department (HOSPITAL_BASED_OUTPATIENT_CLINIC_OR_DEPARTMENT_OTHER): Payer: Medicaid Other

## 2023-04-27 DIAGNOSIS — E876 Hypokalemia: Secondary | ICD-10-CM | POA: Diagnosis not present

## 2023-04-27 DIAGNOSIS — B9789 Other viral agents as the cause of diseases classified elsewhere: Secondary | ICD-10-CM | POA: Diagnosis not present

## 2023-04-27 DIAGNOSIS — N83201 Unspecified ovarian cyst, right side: Secondary | ICD-10-CM | POA: Diagnosis not present

## 2023-04-27 DIAGNOSIS — R1031 Right lower quadrant pain: Secondary | ICD-10-CM

## 2023-04-27 DIAGNOSIS — R8289 Other abnormal findings on cytological and histological examination of urine: Secondary | ICD-10-CM | POA: Insufficient documentation

## 2023-04-27 DIAGNOSIS — Z20822 Contact with and (suspected) exposure to covid-19: Secondary | ICD-10-CM | POA: Diagnosis not present

## 2023-04-27 DIAGNOSIS — R109 Unspecified abdominal pain: Secondary | ICD-10-CM | POA: Diagnosis present

## 2023-04-27 DIAGNOSIS — J069 Acute upper respiratory infection, unspecified: Secondary | ICD-10-CM | POA: Insufficient documentation

## 2023-04-27 LAB — URINALYSIS, ROUTINE W REFLEX MICROSCOPIC
Bilirubin Urine: NEGATIVE
Glucose, UA: NEGATIVE mg/dL
Hgb urine dipstick: NEGATIVE
Ketones, ur: 40 mg/dL — AB
Leukocytes,Ua: NEGATIVE
Nitrite: NEGATIVE
Specific Gravity, Urine: >1.046 — ABNORMAL HIGH (ref 1.005–1.030)
pH: 6.5 (ref 5.0–8.0)

## 2023-04-27 LAB — CBC
HCT: 38.7 % (ref 36.0–46.0)
Hemoglobin: 13.4 g/dL (ref 12.0–15.0)
MCH: 30 pg (ref 26.0–34.0)
MCHC: 34.6 g/dL (ref 30.0–36.0)
MCV: 86.8 fL (ref 80.0–100.0)
Platelets: 321 10*3/uL (ref 150–400)
RBC: 4.46 MIL/uL (ref 3.87–5.11)
RDW: 13.8 % (ref 11.5–15.5)
WBC: 5.6 10*3/uL (ref 4.0–10.5)
nRBC: 0 % (ref 0.0–0.2)

## 2023-04-27 LAB — COMPREHENSIVE METABOLIC PANEL
ALT: 8 U/L (ref 0–44)
AST: 13 U/L — ABNORMAL LOW (ref 15–41)
Albumin: 4.8 g/dL (ref 3.5–5.0)
Alkaline Phosphatase: 35 U/L — ABNORMAL LOW (ref 38–126)
Anion gap: 10 (ref 5–15)
BUN: 10 mg/dL (ref 6–20)
CO2: 23 mmol/L (ref 22–32)
Calcium: 9.8 mg/dL (ref 8.9–10.3)
Chloride: 103 mmol/L (ref 98–111)
Creatinine, Ser: 0.75 mg/dL (ref 0.44–1.00)
GFR, Estimated: >60 mL/min (ref >60)
Glucose, Bld: 118 mg/dL — ABNORMAL HIGH (ref 70–99)
Potassium: 3.4 mmol/L — ABNORMAL LOW (ref 3.5–5.1)
Sodium: 136 mmol/L (ref 135–145)
Total Bilirubin: 0.6 mg/dL (ref 0.3–1.2)
Total Protein: 8.2 g/dL — ABNORMAL HIGH (ref 6.5–8.1)

## 2023-04-27 LAB — RESP PANEL BY RT-PCR (RSV, FLU A&B, COVID)  RVPGX2
Influenza A by PCR: NEGATIVE
Influenza B by PCR: NEGATIVE
Resp Syncytial Virus by PCR: NEGATIVE
SARS Coronavirus 2 by RT PCR: NEGATIVE

## 2023-04-27 LAB — LIPASE, BLOOD: Lipase: <10 U/L — ABNORMAL LOW (ref 11–51)

## 2023-04-27 LAB — HCG, SERUM, QUALITATIVE: Preg, Serum: NEGATIVE

## 2023-04-27 MED ORDER — ONDANSETRON HCL 4 MG/2ML IJ SOLN
4.0000 mg | Freq: Once | INTRAMUSCULAR | Status: AC
  Administered 2023-04-27: 4 mg via INTRAVENOUS
  Filled 2023-04-27: qty 2

## 2023-04-27 MED ORDER — POTASSIUM CHLORIDE CRYS ER 20 MEQ PO TBCR
40.0000 meq | EXTENDED_RELEASE_TABLET | Freq: Once | ORAL | Status: AC
  Administered 2023-04-27: 40 meq via ORAL
  Filled 2023-04-27: qty 2

## 2023-04-27 MED ORDER — SODIUM CHLORIDE 0.9 % IV BOLUS
1000.0000 mL | Freq: Once | INTRAVENOUS | Status: AC
  Administered 2023-04-27: 1000 mL via INTRAVENOUS

## 2023-04-27 MED ORDER — PROMETHAZINE-DM 6.25-15 MG/5ML PO SYRP: 5.0000 mL | ORAL_SOLUTION | Freq: Four times a day (QID) | ORAL | 0 refills | Status: AC | PRN

## 2023-04-27 MED ORDER — IOHEXOL 300 MG/ML  SOLN
100.0000 mL | Freq: Once | INTRAMUSCULAR | Status: AC | PRN
  Administered 2023-04-27: 60 mL via INTRAVENOUS

## 2023-04-27 MED ORDER — MORPHINE SULFATE (PF) 4 MG/ML IV SOLN
4.0000 mg | Freq: Once | INTRAVENOUS | Status: AC
  Administered 2023-04-27: 4 mg via INTRAVENOUS
  Filled 2023-04-27: qty 1

## 2023-04-27 NOTE — Discharge Instructions (Signed)
As we discussed, your workup in the ER today was reassuring for acute findings.  Laboratory evaluation and CT imaging did not reveal any emergent concerns.  I suspect that you have a viral upper respiratory infection causing your cough and congestion and have given you a prescription for Phenergan cough syrup for you to take as prescribed as needed for management of this.  I suspect that your belly pain is due to an ovarian cyst.  I recommend that you follow-up closely with your OB/GYN for continued evaluation and management of this.  Return if development of any new or worsening symptoms.

## 2023-04-27 NOTE — ED Provider Notes (Signed)
[place] EMERGENCY DEPARTMENT AT [facility] Provider Note   CSN: [id number] Arrival date & time: 04/27/23  1628     History  Chief Complaint  Patient presents with   Abdominal Pain    Christina Mays is a 22 y.o. female.  Patient with history of ovarian cysts presents today with complaints of abdominal pain. She states that same began 2 days ago and has been persistent since. She also endorses associated cough and congestion, notes her fiance has been sick with similar symptoms. She endorses nausea without vomiting or diarrhea. States that her pain feels different than her previous pain she has had with ovarian cysts previously. Denies fevers or chills. No urinary symptoms. States her last menstrual cycle was several weeks ago, however states that her cycles are frequently irregular. Denies any vaginal discharge. No history of abdominal surgeries.   The history is provided by the patient. No language interpreter was used.  Abdominal Pain Associated symptoms: cough and nausea   Associated symptoms: no diarrhea and no vomiting        Home Medications Prior to Admission medications   Medication Sig Start Date End Date Taking? Authorizing Provider  acetaminophen (TYLENOL) 500 MG tablet Take 1 tablet (500 mg total) by mouth every 6 (six) hours as needed for mild pain or moderate pain. 09/23/21   Hongalgi, Maximino Greenland, MD  naproxen (NAPROSYN) 500 MG tablet Take 1 tablet (500 mg total) by mouth 2 (two) times daily. 10/26/21   Theron Arista, PA-C  ondansetron (ZOFRAN ODT) 4 MG disintegrating tablet Take 1 tablet (4 mg total) by mouth 3 (three) times daily. Take 1 tab (4 mg total) 3 times daily for 4 to 5 days, then take 1 tab (4 mg total) 2 or 3 times daily only as needed for nausea or vomiting. 09/23/21   Hongalgi, Maximino Greenland, MD  ondansetron (ZOFRAN) 4 MG tablet Take 1 tablet (4 mg total) by mouth every 6 (six) hours as needed for nausea or vomiting. 12/19/22   Blue, Soijett A, PA-C   pantoprazole (PROTONIX) 40 MG tablet Take 1 tablet (40 mg total) by mouth daily. 09/23/21   Hongalgi, Maximino Greenland, MD  potassium chloride SA (KLOR-CON) 20 MEQ tablet Take 2 tablets (40 mEq total) by mouth 2 (two) times daily. 10/26/21   Theron Arista, PA-C  potassium chloride SA (KLOR-CON) 20 MEQ tablet Take 2 tablets (40 mEq total) by mouth daily. 10/26/21   Theron Arista, PA-C      Allergies    Pepto-bismol [bismuth subsalicylate]    Review of Systems   Review of Systems  HENT:  Positive for congestion.   Respiratory:  Positive for cough.   Gastrointestinal:  Positive for abdominal pain and nausea. Negative for diarrhea and vomiting.  All other systems reviewed and are negative.   Physical Exam Updated Vital Signs BP 123/62 (BP Location: Right Arm)   Pulse (!) 105   Temp 99.4 F (37.4 C) (Oral)   Resp 15   Ht 5' (1.524 m)   Wt 45.4 kg   SpO2 100%   BMI 19.53 kg/m  Physical Exam Vitals and nursing note reviewed.  Constitutional:      General: She is not in acute distress.    Appearance: Normal appearance. She is normal weight. She is not ill-appearing, toxic-appearing or diaphoretic.  HENT:     Head: Normocephalic and atraumatic.  Cardiovascular:     Rate and Rhythm: Normal rate.  Pulmonary:     Effort: Pulmonary effort is  normal. No respiratory distress.  Abdominal:     General: Abdomen is flat.     Palpations: Abdomen is soft.     Tenderness: There is abdominal tenderness in the right lower quadrant.  Musculoskeletal:        General: Normal range of motion.     Cervical back: Normal range of motion.  Skin:    General: Skin is warm and dry.  Neurological:     General: No focal deficit present.     Mental Status: She is alert.  Psychiatric:        Mood and Affect: Mood normal.        Behavior: Behavior normal.    ED Results / Procedures / Treatments   Labs (all labs ordered are listed, but only abnormal results are displayed) Labs Reviewed  LIPASE, BLOOD -  Abnormal; Notable for the following components:      Result Value   Lipase <10 (*)    All other components within normal limits  COMPREHENSIVE METABOLIC PANEL - Abnormal; Notable for the following components:   Potassium 3.4 (*)    Glucose, Bld 118 (*)    Total Protein 8.2 (*)    AST 13 (*)    Alkaline Phosphatase 35 (*)    All other components within normal limits  URINALYSIS, ROUTINE W REFLEX MICROSCOPIC - Abnormal; Notable for the following components:   Specific Gravity, Urine >1.046 (*)    Ketones, ur 40 (*)    Protein, ur TRACE (*)    All other components within normal limits  RESP PANEL BY RT-PCR (RSV, FLU A&B, COVID)  RVPGX2  CBC  HCG, SERUM, QUALITATIVE    EKG None  Radiology CT ABDOMEN PELVIS W CONTRAST  Result Date: 04/27/2023 CLINICAL DATA:  Right lower quadrant abdominal pain. EXAM: CT ABDOMEN AND PELVIS WITH CONTRAST TECHNIQUE: Multidetector CT imaging of the abdomen and pelvis was performed using the standard protocol following bolus administration of intravenous contrast. RADIATION DOSE REDUCTION: This exam was performed according to the departmental dose-optimization program which includes automated exposure control, adjustment of the mA and/or kV according to patient size and/or use of iterative reconstruction technique. CONTRAST:  60mL OMNIPAQUE IOHEXOL 300 MG/ML  SOLN COMPARISON:  October 26, 2021 FINDINGS: Lower chest: No acute abnormality. Hepatobiliary: No focal liver abnormality is seen. No gallstones, gallbladder wall thickening, or biliary dilatation. Pancreas: Unremarkable. No pancreatic ductal dilatation or surrounding inflammatory changes. Spleen: Normal in size without focal abnormality. Adrenals/Urinary Tract: Adrenal glands are unremarkable. Kidneys are normal, without renal calculi, focal lesion, or hydronephrosis. Bladder is unremarkable. Stomach/Bowel: Stomach is within normal limits. Appendix appears normal (axial CT images 48 through 52, CT series 2).  No evidence of bowel wall thickening, distention, or inflammatory changes. Vascular/Lymphatic: No significant vascular findings are present. No enlarged abdominal or pelvic lymph nodes. Reproductive: The uterus and left adnexa are unremarkable. A 3.2 cm x 2.7 cm x 2.9 cm simple right adnexal cyst is seen. Other: No abdominal wall hernia or abnormality. No abdominopelvic ascites. Musculoskeletal: No acute or significant osseous findings. IMPRESSION: 1. 3.2 cm x 2.7 cm x 2.9 cm simple right adnexal cyst, likely ovarian in origin. No follow-up imaging is recommended. This recommendation follows ACR consensus guidelines: White Paper of the ACR Incidental Findings Committee II on Adnexal Findings. J Am Coll Radiol [phone]. Electronically Signed   By: Aram Candela M.D.   On: 04/27/2023 19:10    Procedures Procedures    Medications Ordered in ED Medications  sodium chloride  0.9 % bolus 1,000 mL (1,000 mLs Intravenous New Bag/Given 04/27/23 1800)  morphine (PF) 4 MG/ML injection 4 mg (4 mg Intravenous Given 04/27/23 1802)  ondansetron (ZOFRAN) injection 4 mg (4 mg Intravenous Given 04/27/23 1802)  iohexol (OMNIPAQUE) 300 MG/ML solution 100 mL (60 mLs Intravenous Contrast Given 04/27/23 1853)    ED Course/ Medical Decision Making/ A&P                             Medical Decision Making Amount and/or Complexity of Data Reviewed Labs: ordered. Radiology: ordered.  Risk Prescription drug management.   This patient is a 22 y.o. female who presents to the ED for concern of abdominal pain, this involves an extensive number of treatment options, and is a complaint that carries with it a high risk of complications and morbidity. The emergent differential diagnosis prior to evaluation includes, but is not limited to, AAA, gastroenteritis, appendicitis, Bowel obstruction, Bowel perforation. Gastroparesis, DKA, Hernia, Inflammatory bowel disease, mesenteric ischemia, pancreatitis, peritonitis SBP,  volvulus.  This is not an exhaustive differential.   Past Medical History / Co-morbidities / Social History: history of ovarian cysts  Physical Exam: Physical exam performed. The pertinent findings include: RLQ TTP  Lab Tests: I ordered, and personally interpreted labs.  The pertinent results include:  K 3.4, UA with ketones and protein, likely dehydration   Imaging Studies: I ordered imaging studies including CT abdomen pelvis. I independently visualized and interpreted imaging which showed   1. 3.2 cm x 2.7 cm x 2.9 cm simple right adnexal cyst, likely ovarian in origin. No follow-up imaging is recommended.  I agree with the radiologist interpretation.   Medications: I ordered medication including fluids, zofran, potassium, morphine  for dehydration, nausea/vomiting, hypokalemia, pain. Reevaluation of the patient after these medicines showed that the patient improved. I have reviewed the patients home medicines and have made adjustments as needed.    Disposition: After consideration of the diagnostic results and the patients response to treatment, I feel that emergency department workup does not suggest an emergent condition requiring admission or immediate intervention beyond what has been performed at this time. The plan is: discharge with phenergan cough syrup for patients cough and congestion. Recommend close ob/gyn follow-up for management of her ovarian cysts. Evaluation and diagnostic testing in the emergency department does not suggest an emergent condition requiring admission or immediate intervention beyond what has been performed at this time.  Plan for discharge with close PCP follow-up.  Patient is understanding and amenable with plan, educated on red flag symptoms that would prompt immediate return.  Patient discharged in stable condition.  Final Clinical Impression(s) / ED Diagnoses Final diagnoses:  Viral URI with cough  Cyst of right ovary  Right lower quadrant  abdominal pain    Rx / DC Orders ED Discharge Orders          Ordered    promethazine-dextromethorphan (PROMETHAZINE-DM) 6.25-15 MG/5ML syrup  4 times daily PRN        04/27/23 2214          An After Visit Summary was printed and given to the patient.     Vear Clock 04/27/23 2215    Rondel Baton, MD 04/28/23 [phone]

## 2023-04-27 NOTE — ED Notes (Signed)
Pt discharged to home, NAD noted 

## 2023-04-27 NOTE — ED Triage Notes (Signed)
Pt arrives to ED with c/o RLQ abdominal/pelvic pain x2 days. Associated with weakness, headache, nausea, congestion.

## 2023-05-13 ENCOUNTER — Other Ambulatory Visit: Payer: Self-pay

## 2023-05-13 ENCOUNTER — Emergency Department (HOSPITAL_BASED_OUTPATIENT_CLINIC_OR_DEPARTMENT_OTHER)
Admission: EM | Admit: 2023-05-13 | Discharge: 2023-05-13 | Disposition: A | Discharge status: Home / Self Care | Payer: Medicaid Other | Attending: Emergency Medicine | Admitting: Emergency Medicine

## 2023-05-13 ENCOUNTER — Emergency Department (HOSPITAL_BASED_OUTPATIENT_CLINIC_OR_DEPARTMENT_OTHER): Payer: Medicaid Other

## 2023-05-13 ENCOUNTER — Encounter (HOSPITAL_BASED_OUTPATIENT_CLINIC_OR_DEPARTMENT_OTHER): Payer: Self-pay | Admitting: Emergency Medicine

## 2023-05-13 DIAGNOSIS — N760 Acute vaginitis: Secondary | ICD-10-CM

## 2023-05-13 DIAGNOSIS — B760 Ancylostomiasis: Secondary | ICD-10-CM | POA: Insufficient documentation

## 2023-05-13 DIAGNOSIS — R109 Unspecified abdominal pain: Secondary | ICD-10-CM | POA: Diagnosis present

## 2023-05-13 DIAGNOSIS — D219 Benign neoplasm of connective and other soft tissue, unspecified: Secondary | ICD-10-CM | POA: Diagnosis not present

## 2023-05-13 LAB — WET PREP, GENITAL
Sperm: NONE SEEN
Trich, Wet Prep: NONE SEEN
WBC, Wet Prep HPF POC: <10 (ref <10)
Yeast Wet Prep HPF POC: NONE SEEN

## 2023-05-13 LAB — COMPREHENSIVE METABOLIC PANEL
ALT: 14 U/L (ref 0–44)
AST: 18 U/L (ref 15–41)
Albumin: 4.8 g/dL (ref 3.5–5.0)
Alkaline Phosphatase: 39 U/L (ref 38–126)
Anion gap: 7 (ref 5–15)
BUN: 10 mg/dL (ref 6–20)
CO2: 26 mmol/L (ref 22–32)
Calcium: 9.9 mg/dL (ref 8.9–10.3)
Chloride: 105 mmol/L (ref 98–111)
Creatinine, Ser: 0.6 mg/dL (ref 0.44–1.00)
GFR, Estimated: >60 mL/min (ref >60)
Glucose, Bld: 90 mg/dL (ref 70–99)
Potassium: 4 mmol/L (ref 3.5–5.1)
Sodium: 138 mmol/L (ref 135–145)
Total Bilirubin: 0.5 mg/dL (ref 0.3–1.2)
Total Protein: 8.2 g/dL — ABNORMAL HIGH (ref 6.5–8.1)

## 2023-05-13 LAB — URINALYSIS, ROUTINE W REFLEX MICROSCOPIC
Bacteria, UA: NONE SEEN
Bilirubin Urine: NEGATIVE
Glucose, UA: NEGATIVE mg/dL
Hgb urine dipstick: NEGATIVE
Ketones, ur: NEGATIVE mg/dL
Leukocytes,Ua: NEGATIVE
Nitrite: NEGATIVE
Protein, ur: 30 mg/dL — AB
Specific Gravity, Urine: 1.028 (ref 1.005–1.030)
pH: 8 (ref 5.0–8.0)

## 2023-05-13 LAB — CBC
HCT: 39.7 % (ref 36.0–46.0)
Hemoglobin: 13.4 g/dL (ref 12.0–15.0)
MCH: 29.6 pg (ref 26.0–34.0)
MCHC: 33.8 g/dL (ref 30.0–36.0)
MCV: 87.6 fL (ref 80.0–100.0)
Platelets: 305 10*3/uL (ref 150–400)
RBC: 4.53 MIL/uL (ref 3.87–5.11)
RDW: 13.6 % (ref 11.5–15.5)
WBC: 4.2 10*3/uL (ref 4.0–10.5)
nRBC: 0 % (ref 0.0–0.2)

## 2023-05-13 LAB — PREGNANCY, URINE: Preg Test, Ur: NEGATIVE

## 2023-05-13 LAB — LIPASE, BLOOD: Lipase: <10 U/L — ABNORMAL LOW (ref 11–51)

## 2023-05-13 MED ORDER — KETOROLAC TROMETHAMINE 30 MG/ML IJ SOLN
30.0000 mg | Freq: Once | INTRAMUSCULAR | Status: AC
  Administered 2023-05-13: 30 mg via INTRAVENOUS
  Filled 2023-05-13: qty 1

## 2023-05-13 MED ORDER — METRONIDAZOLE 500 MG PO TABS: 500.0000 mg | ORAL_TABLET | Freq: Two times a day (BID) | ORAL | 0 refills | Status: AC

## 2023-05-13 MED ORDER — ONDANSETRON 4 MG PO TBDP: 4.0000 mg | ORAL_TABLET | Freq: Three times a day (TID) | ORAL | 0 refills | Status: AC | PRN

## 2023-05-13 MED ORDER — ONDANSETRON HCL 4 MG/2ML IJ SOLN
4.0000 mg | Freq: Once | INTRAMUSCULAR | Status: AC
  Administered 2023-05-13: 4 mg via INTRAVENOUS
  Filled 2023-05-13: qty 2
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
## 2023-05-13 NOTE — Discharge Instructions (Addendum)
Please take your antibiotics as prescribed. Take tylenol/ibuprofen every 6 hours for pain. I recommend close follow-up with PCP and OB-GYN for reevaluation.  Please do not hesitate to return to emergency department if worrisome signs symptoms we discussed become apparent.

## 2023-05-13 NOTE — ED Notes (Signed)
[place] called  pt ready for US Pelvic done with speculum and  bimanual , spec to lab , pt had some tenderness

## 2023-05-13 NOTE — ED Notes (Signed)
Back from US.

## 2023-05-13 NOTE — ED Provider Notes (Signed)
[place] EMERGENCY DEPARTMENT AT [facility] Provider Note   CSN: [id number] Arrival date & time: 05/13/23  1004     History  Chief Complaint  Patient presents with   Emesis    Christina Mays is a 22 y.o. female past with history of ovarian cysts presents today for evaluation of abdominal pain.  Patient reports acute onset of abdominal pain this morning.  Pain is located in her right lower quadrant, similar to when she had pain due to ovarian cyst.  She was seen here on 04/27/2023, CT scan showed a 3 x 3 x 3 simple right ovarian cyst.  States she has been unable to take her pain medication due to nausea and vomiting.  She denies any fever, chest pain, shortness of breath, bowel changes, urinary symptoms, abnormal vaginal discharge or vaginal bleeding.   Emesis     Past Medical History:  Diagnosis Date   Migraine with aura    Ovarian cyst    Past Surgical History:  Procedure Laterality Date   BIOPSY  09/22/2021   Procedure: BIOPSY;  Surgeon: Benancio Deeds, MD;  Location: [facility];  Service: Gastroenterology;;   ESOPHAGOGASTRODUODENOSCOPY (EGD) WITH PROPOFOL N/A 09/22/2021   Procedure: ESOPHAGOGASTRODUODENOSCOPY (EGD) WITH PROPOFOL;  Surgeon: Benancio Deeds, MD;  Location: [facility];  Service: Gastroenterology;  Laterality: N/A;   MOUTH SURGERY     UMBILICAL HERNIA REPAIR       Home Medications Prior to Admission medications   Medication Sig Start Date End Date Taking? Authorizing Provider  metroNIDAZOLE (FLAGYL) 500 MG tablet Take 1 tablet (500 mg total) by mouth 2 (two) times daily. 05/13/23  Yes Jeanelle Malling, PA  ondansetron (ZOFRAN-ODT) 4 MG disintegrating tablet Take 1 tablet (4 mg total) by mouth every 8 (eight) hours as needed. 05/13/23  Yes Jeanelle Malling, PA  acetaminophen (TYLENOL) 500 MG tablet Take 1 tablet (500 mg total) by mouth every 6 (six) hours as needed for mild pain or moderate pain. 09/23/21   Hongalgi, Maximino Greenland, MD  naproxen (NAPROSYN) 500 MG  tablet Take 1 tablet (500 mg total) by mouth 2 (two) times daily. 10/26/21   Theron Arista, PA-C  ondansetron (ZOFRAN ODT) 4 MG disintegrating tablet Take 1 tablet (4 mg total) by mouth 3 (three) times daily. Take 1 tab (4 mg total) 3 times daily for 4 to 5 days, then take 1 tab (4 mg total) 2 or 3 times daily only as needed for nausea or vomiting. 09/23/21   Hongalgi, Maximino Greenland, MD  ondansetron (ZOFRAN) 4 MG tablet Take 1 tablet (4 mg total) by mouth every 6 (six) hours as needed for nausea or vomiting. 12/19/22   Blue, Soijett A, PA-C  pantoprazole (PROTONIX) 40 MG tablet Take 1 tablet (40 mg total) by mouth daily. 09/23/21   Hongalgi, Maximino Greenland, MD  potassium chloride SA (KLOR-CON) 20 MEQ tablet Take 2 tablets (40 mEq total) by mouth 2 (two) times daily. 10/26/21   Theron Arista, PA-C  potassium chloride SA (KLOR-CON) 20 MEQ tablet Take 2 tablets (40 mEq total) by mouth daily. 10/26/21   Theron Arista, PA-C  promethazine-dextromethorphan (PROMETHAZINE-DM) 6.25-15 MG/5ML syrup Take 5 mLs by mouth 4 (four) times daily as needed for cough. 04/27/23   Smoot, Shawn Route, PA-C      Allergies    Pepto-bismol [bismuth subsalicylate]    Review of Systems   Review of Systems  Gastrointestinal:  Positive for vomiting.    Physical Exam Updated Vital Signs BP (!) 110/58 (  BP Location: Right Arm)   Pulse 67   Temp 98 F (36.7 C) (Oral)   Resp 16   Ht 5' (1.524 m)   Wt 45 kg   LMP 04/04/2023   SpO2 99%   BMI 19.38 kg/m  Physical Exam Vitals and nursing note reviewed.  Constitutional:      Appearance: Normal appearance.  HENT:     Head: Normocephalic and atraumatic.     Mouth/Throat:     Mouth: Mucous membranes are moist.  Eyes:     General: No scleral icterus. Cardiovascular:     Rate and Rhythm: Normal rate and regular rhythm.     Pulses: Normal pulses.     Heart sounds: Normal heart sounds.  Pulmonary:     Effort: Pulmonary effort is normal.     Breath sounds: Normal breath sounds.  Abdominal:      General: Abdomen is flat.     Palpations: Abdomen is soft.     Tenderness: There is abdominal tenderness in the right lower quadrant.  Musculoskeletal:        General: No deformity.  Skin:    General: Skin is warm.     Findings: No rash.  Neurological:     General: No focal deficit present.     Mental Status: She is alert.  Psychiatric:        Mood and Affect: Mood normal.     ED Results / Procedures / Treatments   Labs (all labs ordered are listed, but only abnormal results are displayed) Labs Reviewed  WET PREP, GENITAL - Abnormal; Notable for the following components:      Result Value   Clue Cells Wet Prep HPF POC PRESENT (*)    All other components within normal limits  LIPASE, BLOOD - Abnormal; Notable for the following components:   Lipase <10 (*)    All other components within normal limits  COMPREHENSIVE METABOLIC PANEL - Abnormal; Notable for the following components:   Total Protein 8.2 (*)    All other components within normal limits  URINALYSIS, ROUTINE W REFLEX MICROSCOPIC - Abnormal; Notable for the following components:   Protein, ur 30 (*)    All other components within normal limits  CBC  PREGNANCY, URINE  GC/CHLAMYDIA PROBE AMP ([place]) NOT AT [facility]    EKG None  Radiology US PELVIC COMPLETE W TRANSVAGINAL AND TORSION R/O  Result Date: 05/13/2023 CLINICAL DATA:  Pelvic pain EXAM: TRANSABDOMINAL AND TRANSVAGINAL ULTRASOUND OF PELVIS DOPPLER ULTRASOUND OF OVARIES TECHNIQUE: Both transabdominal and transvaginal ultrasound examinations of the pelvis were performed. Transabdominal technique was performed for global imaging of the pelvis including uterus, ovaries, adnexal regions, and pelvic cul-de-sac. It was necessary to proceed with endovaginal exam following the transabdominal exam to visualize the endometrium and ovaries. Color and duplex Doppler ultrasound was utilized to evaluate blood flow to the ovaries. COMPARISON:  12/19/2022 FINDINGS: Uterus  Measurements: 7.8 x 4.1 by 4.5 cm = volume: 76 mL. There is 1.1 cm hypoechoic area in the left side of the fundus of the uterus, possibly small fibroid. Endometrium Thickness: 10 mm.  No focal abnormality visualized. Right ovary Measurements: 4.4 x 3 x 3.2 cm = volume: 22.3 mL. There is 1.8 cm complex echogenic area in the right ovary. There is a 1.9 x 1.7 x 1.5 cm hypoechoic area in the right ovary. This may suggest hemorrhagic cysts or follicles. There are other subcentimeter follicles in the right ovary. Left ovary Measurements: 3.7 x 2.6 x 2.7  cm = volume: 13.5 mL. There is 3.1 x 1.7 x 1.9 cm hypoechoic area in the left ovary without demonstrable internal vascularity, possibly hemorrhagic functional cyst. Pulsed Doppler evaluation of both ovaries demonstrates normal low-resistance arterial and venous waveforms. Other findings No abnormal free fluid. IMPRESSION: There is no evidence of ovarian torsion. There is 1.1 cm uterine fibroid. There are few hypoechoic structures in both ovaries suggesting hemorrhagic cysts or follicles. Electronically Signed   By: Ernie Avena M.D.   On: 05/13/2023 14:23    Procedures Procedures    Medications Ordered in ED Medications  ondansetron (ZOFRAN) injection 4 mg (4 mg Intravenous Given 05/13/23 1353)  ketorolac (TORADOL) 30 MG/ML injection 30 mg (30 mg Intravenous Given 05/13/23 1353)    ED Course/ Medical Decision Making/ A&P                             Medical Decision Making Amount and/or Complexity of Data Reviewed Labs: ordered. Radiology: ordered.  Risk Prescription drug management.   This patient presents to the ED for abdominal pain, this involves an extensive number of treatment options, and is a complaint that carries with a high risk of complications and morbidity.  The differential diagnosis includes ovarian cyst, ovarian torsion, fibroids, PID/TOA, BV, trichomonas, STD, appendicitis, SBO, gastroenteritis diverticulitis, UTI, kidney stone,  infectious etiology.  This is not an exhaustive list.  Lab tests: I ordered and personally interpreted labs.  The pertinent results include: WBC unremarkable. Hbg unremarkable. Platelets unremarkable. Electrolytes unremarkable. BUN, creatinine unremarkable.  Wet prep positive for BV.  Lipase unremarkable.  Urinalysis normal.  Imaging studies: I ordered imaging studies. I personally reviewed, interpreted imaging and agree with the radiologist's interpretations. The results include: Ultrasound show a 1.1 cm uterine fibroid, no evidence of ovarian torsion.  Problem list/ ED course/ Critical interventions/ Medical management: HPI: See above Vital signs within normal range and stable throughout visit. Laboratory/imaging studies significant for: See above. On physical examination, patient is afebrile and appears in no acute distress.  This patient presents with lower abdominal pain and pelvic pain.  Abdominal exam without peritoneal signs.  No evidence of acute abdomen at this time.  Well-appearing.  Patient with pelvic exam done with RN present throughout examination with no CMT, adnexal tenderness concerning for PID or TOA.  Considered and doubt ovarian torsion given ultrasound results.  Wet prep was positive for BV.  Will send in Rx of metronidazole.  Presentation not consistent with other acute emergent causes of abdominal pain at this time to include appendicitis, SBO or ectopic pregnancy.  Given Toradol for pain and Zofran for nausea.  Reevaluation of patient after this medication showed that the patient improved.  Advised patient to take Tylenol/ibuprofen/naproxen for pain, follow-up with OB/GYN and primary care physician for further evaluation and management, return to the ER if new or worsening symptoms.  I have reviewed the patient home medicines and have made adjustments as needed.  Cardiac monitoring/EKG: The patient was maintained on a cardiac monitor.  I personally reviewed and interpreted  the cardiac monitor which showed an underlying rhythm of: sinus rhythm.  Additional history obtained: External records from outside source obtained and reviewed including: Chart review including previous notes, labs, imaging.  Consultations obtained:  Disposition Continued outpatient therapy. Follow-up with PCP and OB/GYN recommended for reevaluation of symptoms. Treatment plan discussed with patient.  Pt acknowledged understanding was agreeable to the plan. Worrisome signs and symptoms were discussed with  patient, and patient acknowledged understanding to return to the ED if they noticed these signs and symptoms. Patient was stable upon discharge.   This chart was dictated using voice recognition software.  Despite best efforts to proofread,  errors can occur which can change the documentation meaning.          Final Clinical Impression(s) / ED Diagnoses Final diagnoses:  Bacterial vaginosis  Fibroid    Rx / DC Orders ED Discharge Orders          Ordered    metroNIDAZOLE (FLAGYL) 500 MG tablet  2 times daily        05/13/23 1445    ondansetron (ZOFRAN-ODT) 4 MG disintegrating tablet  Every 8 hours PRN        05/13/23 1445              Jeanelle Malling, PA 05/13/23 1500    Mardene Sayer, MD 05/13/23 [id number]

## 2023-05-13 NOTE — ED Notes (Signed)
Attempted to get patient for her ultrasound. PA would like to do pelvic exam prior to ultrasound. Informed PA the ultrasound is a timed study due to r/o torsion order. PA is aware and will let tech know when patient is ready to go to ultrasound.

## 2023-05-13 NOTE — ED Notes (Signed)
To US

## 2023-05-15 LAB — GC/CHLAMYDIA PROBE AMP (~~LOC~~) NOT AT ARMC
Chlamydia: NEGATIVE
Comment: NEGATIVE
Comment: NORMAL
Neisseria Gonorrhea: NEGATIVE

## 2023-06-12 ENCOUNTER — Emergency Department (HOSPITAL_COMMUNITY): Payer: Medicaid Other

## 2023-06-12 ENCOUNTER — Emergency Department (HOSPITAL_COMMUNITY)
Admission: EM | Admit: 2023-06-12 | Discharge: 2023-06-12 | Disposition: A | Discharge status: Home / Self Care | Payer: Medicaid Other | Attending: Emergency Medicine | Admitting: Emergency Medicine

## 2023-06-12 ENCOUNTER — Other Ambulatory Visit: Payer: Self-pay

## 2023-06-12 ENCOUNTER — Encounter (HOSPITAL_COMMUNITY): Payer: Self-pay

## 2023-06-12 DIAGNOSIS — R1011 Right upper quadrant pain: Secondary | ICD-10-CM | POA: Insufficient documentation

## 2023-06-12 DIAGNOSIS — N83202 Unspecified ovarian cyst, left side: Secondary | ICD-10-CM | POA: Diagnosis not present

## 2023-06-12 DIAGNOSIS — S70311A Abrasion, right thigh, initial encounter: Secondary | ICD-10-CM | POA: Diagnosis present

## 2023-06-12 DIAGNOSIS — Y92410 Unspecified street and highway as the place of occurrence of the external cause: Secondary | ICD-10-CM | POA: Diagnosis not present

## 2023-06-12 DIAGNOSIS — M79602 Pain in left arm: Secondary | ICD-10-CM | POA: Insufficient documentation

## 2023-06-12 DIAGNOSIS — T148XXA Other injury of unspecified body region, initial encounter: Secondary | ICD-10-CM

## 2023-06-12 LAB — COMPREHENSIVE METABOLIC PANEL
ALT: 24 U/L (ref 0–44)
AST: 36 U/L (ref 15–41)
Albumin: 4.2 g/dL (ref 3.5–5.0)
Alkaline Phosphatase: 36 U/L — ABNORMAL LOW (ref 38–126)
Anion gap: 8 (ref 5–15)
BUN: 8 mg/dL (ref 6–20)
CO2: 23 mmol/L (ref 22–32)
Calcium: 9.7 mg/dL (ref 8.9–10.3)
Chloride: 105 mmol/L (ref 98–111)
Creatinine, Ser: 0.77 mg/dL (ref 0.44–1.00)
GFR, Estimated: >60 mL/min (ref >60)
Glucose, Bld: 94 mg/dL (ref 70–99)
Potassium: 3.8 mmol/L (ref 3.5–5.1)
Sodium: 136 mmol/L (ref 135–145)
Total Bilirubin: 0.7 mg/dL (ref 0.3–1.2)
Total Protein: 7.9 g/dL (ref 6.5–8.1)

## 2023-06-12 LAB — CBC WITH DIFFERENTIAL/PLATELET
Abs Immature Granulocytes: 0.05 10*3/uL (ref 0.00–0.07)
Basophils Absolute: 0 10*3/uL (ref 0.0–0.1)
Basophils Relative: 0 %
Eosinophils Absolute: 0 10*3/uL (ref 0.0–0.5)
Eosinophils Relative: 0 %
HCT: 42.3 % (ref 36.0–46.0)
Hemoglobin: 14.1 g/dL (ref 12.0–15.0)
Immature Granulocytes: 1 %
Lymphocytes Relative: 19 %
Lymphs Abs: 1.4 10*3/uL (ref 0.7–4.0)
MCH: 30.3 pg (ref 26.0–34.0)
MCHC: 33.3 g/dL (ref 30.0–36.0)
MCV: 90.8 fL (ref 80.0–100.0)
Monocytes Absolute: 0.4 10*3/uL (ref 0.1–1.0)
Monocytes Relative: 6 %
Neutro Abs: 5.7 10*3/uL (ref 1.7–7.7)
Neutrophils Relative %: 74 %
Platelets: 290 10*3/uL (ref 150–400)
RBC: 4.66 MIL/uL (ref 3.87–5.11)
RDW: 13.4 % (ref 11.5–15.5)
WBC: 7.7 10*3/uL (ref 4.0–10.5)
nRBC: 0 % (ref 0.0–0.2)

## 2023-06-12 LAB — I-STAT CHEM 8, ED
BUN: 8 mg/dL (ref 6–20)
Calcium, Ion: 1.18 mmol/L (ref 1.15–1.40)
Chloride: 106 mmol/L (ref 98–111)
Creatinine, Ser: 0.7 mg/dL (ref 0.44–1.00)
Glucose, Bld: 96 mg/dL (ref 70–99)
HCT: 45 % (ref 36.0–46.0)
Hemoglobin: 15.3 g/dL — ABNORMAL HIGH (ref 12.0–15.0)
Potassium: 3.8 mmol/L (ref 3.5–5.1)
Sodium: 138 mmol/L (ref 135–145)
TCO2: 22 mmol/L (ref 22–32)

## 2023-06-12 LAB — HCG, SERUM, QUALITATIVE: Preg, Serum: NEGATIVE

## 2023-06-12 MED ORDER — OXYCODONE-ACETAMINOPHEN 5-325 MG PO TABS
1.0000 | ORAL_TABLET | Freq: Once | ORAL | Status: AC
  Administered 2023-06-12: 1 via ORAL
  Filled 2023-06-12: qty 1

## 2023-06-12 MED ORDER — IOHEXOL 350 MG/ML SOLN
75.0000 mL | Freq: Once | INTRAVENOUS | Status: AC | PRN
  Administered 2023-06-12: 75 mL via INTRAVENOUS

## 2023-06-12 MED ORDER — ONDANSETRON HCL 4 MG/2ML IJ SOLN
4.0000 mg | Freq: Once | INTRAMUSCULAR | Status: AC
  Administered 2023-06-12: 4 mg via INTRAVENOUS
  Filled 2023-06-12: qty 2

## 2023-06-12 NOTE — ED Notes (Signed)
Patient transported to CT 
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
## 2023-06-12 NOTE — ED Notes (Signed)
Pt was ambulating in room when RN entered. Pt was moaning slightly. Pt says she has 6/10 pain at her left leg and left hand.
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
## 2023-10-09 NOTE — Telephone Encounter (Signed)
Closing previous encounter  Denyce Robert, NP

## 2024-02-05 ENCOUNTER — Encounter (HOSPITAL_BASED_OUTPATIENT_CLINIC_OR_DEPARTMENT_OTHER): Payer: Self-pay

## 2024-02-05 ENCOUNTER — Other Ambulatory Visit: Payer: Self-pay

## 2024-02-05 ENCOUNTER — Emergency Department (HOSPITAL_BASED_OUTPATIENT_CLINIC_OR_DEPARTMENT_OTHER)
Admission: EM | Admit: 2024-02-05 | Discharge: 2024-02-05 | Discharge status: Against Medical Advice | Attending: Emergency Medicine | Admitting: Emergency Medicine

## 2024-02-05 DIAGNOSIS — R1030 Lower abdominal pain, unspecified: Secondary | ICD-10-CM | POA: Insufficient documentation

## 2024-02-05 DIAGNOSIS — Z5321 Procedure and treatment not carried out due to patient leaving prior to being seen by health care provider: Secondary | ICD-10-CM | POA: Diagnosis not present

## 2024-02-05 LAB — CBC
HCT: 37 % (ref 36.0–46.0)
Hemoglobin: 12.7 g/dL (ref 12.0–15.0)
MCH: 30.7 pg (ref 26.0–34.0)
MCHC: 34.3 g/dL (ref 30.0–36.0)
MCV: 89.4 fL (ref 80.0–100.0)
Platelets: 241 10*3/uL (ref 150–400)
RBC: 4.14 MIL/uL (ref 3.87–5.11)
RDW: 12.8 % (ref 11.5–15.5)
WBC: 6.5 10*3/uL (ref 4.0–10.5)
nRBC: 0 % (ref 0.0–0.2)

## 2024-02-05 LAB — COMPREHENSIVE METABOLIC PANEL
ALT: 7 U/L (ref 0–44)
AST: 13 U/L — ABNORMAL LOW (ref 15–41)
Albumin: 5.1 g/dL — ABNORMAL HIGH (ref 3.5–5.0)
Alkaline Phosphatase: 44 U/L (ref 38–126)
Anion gap: 11 (ref 5–15)
BUN: 10 mg/dL (ref 6–20)
CO2: 23 mmol/L (ref 22–32)
Calcium: 9.8 mg/dL (ref 8.9–10.3)
Chloride: 99 mmol/L (ref 98–111)
Creatinine, Ser: 0.76 mg/dL (ref 0.44–1.00)
GFR, Estimated: >60 mL/min (ref >60)
Glucose, Bld: 110 mg/dL — ABNORMAL HIGH (ref 70–99)
Potassium: 3.4 mmol/L — ABNORMAL LOW (ref 3.5–5.1)
Sodium: 133 mmol/L — ABNORMAL LOW (ref 135–145)
Total Bilirubin: 0.5 mg/dL (ref 0.0–1.2)
Total Protein: 8.2 g/dL — ABNORMAL HIGH (ref 6.5–8.1)

## 2024-02-05 LAB — LIPASE, BLOOD: Lipase: <10 U/L — ABNORMAL LOW (ref 11–51)

## 2024-02-05 NOTE — ED Triage Notes (Signed)
 Pt BIB [facility] for eval of "sharp" lower abd pain onset approx ago. Hx ovarian cyst, rupture. Seen here for same, was sent home w meds for mgmt.
